# Patient Record
Sex: Female | Born: 1980 | Race: White | Hispanic: No | Marital: Single | Smoking: Never smoker
Health system: Southern US, Community
[De-identification: ages and names within clinical notes are randomized; demographics above are authoritative.]

## PROBLEM LIST (undated history)

## (undated) DIAGNOSIS — S83519A Sprain of anterior cruciate ligament of unspecified knee, initial encounter: Secondary | ICD-10-CM

## (undated) DIAGNOSIS — M94261 Chondromalacia, right knee: Secondary | ICD-10-CM

---

## 2010-05-09 HISTORY — PX: INGUINAL HERNIA REPAIR: SHX194

## 2015-06-17 ENCOUNTER — Encounter: Payer: Self-pay | Admitting: Family Medicine

## 2015-06-17 ENCOUNTER — Ambulatory Visit (INDEPENDENT_AMBULATORY_CARE_PROVIDER_SITE_OTHER): Payer: Managed Care, Other (non HMO) | Admitting: Family Medicine

## 2015-06-17 VITALS — BP 119/81 | HR 76 | Ht 66.0 in | Wt 155.0 lb

## 2015-06-17 DIAGNOSIS — M25551 Pain in right hip: Secondary | ICD-10-CM | POA: Diagnosis not present

## 2015-06-17 NOTE — Patient Instructions (Signed)
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 Ice over area of pain 3-4 times a day for 15 minutes at a time Standing hip rotations, hip side raise exercise 3 sets of 10-15 once a day - add weights if this becomes too easy. Stretches - pick 2 and hold for 20-30 seconds x 3 - do once or twice a day. Tylenol and/or aleve as needed for pain. Let me know if you want to try physical therapy or a cortisone injection. Given the length of time this has been going on can consider repeating your x-rays and an MRI of the area though these are likely to be normal based on your exam. Follow up with me in 5-6 weeks otherwise.

## 2015-06-18 DIAGNOSIS — M25551 Pain in right hip: Secondary | ICD-10-CM

## 2015-06-18 MED ORDER — METHYLPREDNISOLONE ACETATE 40 MG/ML IJ SUSP
40.0000 mg | Freq: Once | INTRAMUSCULAR | Status: AC
  Administered 2015-06-18: 40 mg via INTRA_ARTICULAR

## 2015-06-19 DIAGNOSIS — M25551 Pain in right hip: Secondary | ICD-10-CM | POA: Insufficient documentation

## 2015-06-19 NOTE — Assessment & Plan Note (Signed)
initial injury was traumatic to lateral hip with negative radiographs from 2009 (these are not available for review).  Based on her exam this fits with IT band syndrome and trochanteric bursitis.  We reviewed a home exercise program, stretches.  Tylenol/aleve as needed.  Bursa injection given as well.  Given length of time she has had this we discussed repeating imaging, considering MRI though based on her exam these are likely to be negative for other pathology.  She will call us if she wants to do physical therapy as well.  Follow up in 5-6 weeks.  After informed written consent patient was lying on left side on exam table.  Area overlying right trochanteric bursa prepped with alcohol swab then injected with 6:2 marcaine: depomedrol.  Patient tolerated procedure well without immediate complications.
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
## 2015-07-27 ENCOUNTER — Ambulatory Visit (INDEPENDENT_AMBULATORY_CARE_PROVIDER_SITE_OTHER): Payer: Managed Care, Other (non HMO) | Admitting: Family Medicine

## 2015-07-27 ENCOUNTER — Encounter: Payer: Self-pay | Admitting: Family Medicine

## 2015-07-27 ENCOUNTER — Encounter (INDEPENDENT_AMBULATORY_CARE_PROVIDER_SITE_OTHER): Payer: Self-pay

## 2015-07-27 VITALS — BP 131/83 | HR 71 | Ht 66.0 in | Wt 160.0 lb

## 2015-07-27 DIAGNOSIS — M25551 Pain in right hip: Secondary | ICD-10-CM | POA: Diagnosis not present

## 2015-07-27 NOTE — Patient Instructions (Signed)
Continue with your home exercises and stretches. Let us know if you want to do physical therapy or an MRI. You can check with Darl PikesSusan at the front desk in physical therapy - she may be able to let you know the cost of each visit. Otherwise follow up with me in 6 weeks or as needed.

## 2015-07-30 NOTE — Assessment & Plan Note (Signed)
initial injury was traumatic to lateral hip with negative radiographs from 2009 (these are not available for review).  Based on her exam this fits with IT band syndrome and trochanteric bursitis.  S/p trochanteric injection and has been doing home exercises but pain level still the same at 4/10.  We discussed options: physical therapy or MRI.  She will consider these and let us know if she wants to do either.  F/u prn otherwise.
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
## 2016-08-05 ENCOUNTER — Emergency Department (HOSPITAL_BASED_OUTPATIENT_CLINIC_OR_DEPARTMENT_OTHER)
Admission: EM | Admit: 2016-08-05 | Discharge: 2016-08-05 | Disposition: A | Discharge status: Home / Self Care | Payer: Managed Care, Other (non HMO) | Attending: Emergency Medicine | Admitting: Emergency Medicine

## 2016-08-05 ENCOUNTER — Emergency Department (HOSPITAL_BASED_OUTPATIENT_CLINIC_OR_DEPARTMENT_OTHER): Payer: Managed Care, Other (non HMO)

## 2016-08-05 ENCOUNTER — Encounter (HOSPITAL_BASED_OUTPATIENT_CLINIC_OR_DEPARTMENT_OTHER): Payer: Self-pay

## 2016-08-05 DIAGNOSIS — Y9389 Activity, other specified: Secondary | ICD-10-CM | POA: Insufficient documentation

## 2016-08-05 DIAGNOSIS — Y999 Unspecified external cause status: Secondary | ICD-10-CM | POA: Insufficient documentation

## 2016-08-05 DIAGNOSIS — X500XXA Overexertion from strenuous movement or load, initial encounter: Secondary | ICD-10-CM | POA: Diagnosis not present

## 2016-08-05 DIAGNOSIS — Y9289 Other specified places as the place of occurrence of the external cause: Secondary | ICD-10-CM | POA: Diagnosis not present

## 2016-08-05 DIAGNOSIS — S80212A Abrasion, left knee, initial encounter: Secondary | ICD-10-CM | POA: Diagnosis not present

## 2016-08-05 DIAGNOSIS — M25561 Pain in right knee: Secondary | ICD-10-CM | POA: Insufficient documentation

## 2016-08-05 DIAGNOSIS — S8992XA Unspecified injury of left lower leg, initial encounter: Secondary | ICD-10-CM | POA: Diagnosis present

## 2016-08-05 MED ORDER — IBUPROFEN 800 MG PO TABS: 800.0000 mg | ORAL_TABLET | Freq: Three times a day (TID) | ORAL | 0 refills | Status: DC

## 2016-08-05 MED FILL — IBUPROFEN 800 MG TABLET: 800 | 7 days supply | Qty: 21 | Fill #0

## 2016-08-05 NOTE — ED Notes (Signed)
Patient is resting comfortably. 

## 2016-08-05 NOTE — Discharge Instructions (Signed)
Take your medication as prescribed as needed for pain and swelling. I also recommend continuing to rest, elevate and apply ice your knee for 15-20 minutes 3-4 times daily. If her symptoms have not improved over the next week I recommend calling the orthopedic office below for further management and evaluation. Return to the emergency department if symptoms worsen or new onset of fever, redness, swelling, warmth, numbness, weakness, decreased range of motion, unable to ambulate.

## 2016-08-05 NOTE — ED Provider Notes (Signed)
[facility] Provider Note   CSN: [id number] Arrival date & time: 08/05/16  1300     History   Chief Complaint Chief Complaint  Patient presents with  . Knee Injury    HPI Carly Washington is a 36 y.o. female.  HPI   Patient is a 36 year old female with past medical history of systems the ED with right knee pain, onset prior to arrival. Patient reports while she was lifting at the gym she was trying to do a squat press she lost her balance resulting in her right knee turning out words she was squatting down. Patient reports "I feel like my knee gave out and then went back in place". She reports having associated pain and mild swelling to her knee since the injury. Patient reports applying ice and taking ibuprofen with improvement of pain. She reports having the barbell hit the side of her left leg resulting in a few small scrapes but denies any associated pain. Patient denies falling or any head injury. Denies any prior injuries or surgeries to either knee. Denies numbness, tingling, weakness.  History reviewed. No pertinent past medical history.  Patient Active Problem List   Diagnosis Date Noted  . Right hip pain 06/19/2015    Past Surgical History:  Procedure Laterality Date  . HERNIA REPAIR      OB History    No data available       Home Medications    Prior to Admission medications   Medication Sig Start Date End Date Taking? Authorizing Provider  desogestrel-ethinyl estradiol (APRI,EMOQUETTE,SOLIA) 0.15-30 MG-MCG tablet Take by mouth. 06/29/15   Historical Provider, MD  ibuprofen (ADVIL,MOTRIN) 800 MG tablet Take 1 tablet (800 mg total) by mouth 3 (three) times daily. 08/05/16   Barrett Henle, PA-C    Family History No family history on file.  Social History Social History  Substance Use Topics  . Smoking status: Never Smoker  . Smokeless tobacco: Never Used  . Alcohol use Yes     Comment: occ     Allergies   Sulfa  antibiotics   Review of Systems Review of Systems  Constitutional: Negative for fever.  Musculoskeletal: Positive for arthralgias (right knee) and joint swelling.  Skin: Positive for wound (scrape).  Neurological: Negative for weakness.     Physical Exam Updated Vital Signs BP 138/84 (BP Location: Right Arm)   Pulse 83   Temp 98.6 F (37 C) (Oral)   Resp 16   Ht  (1.676 m)   Wt 72.6 kg   LMP 07/24/2016   SpO2 100%   BMI 25.82 kg/m   Physical Exam  Constitutional: She is oriented to person, place, and time. She appears well-developed and well-nourished.  HENT:  Head: Normocephalic and atraumatic.  Eyes: Conjunctivae and EOM are normal. Right eye exhibits no discharge. Left eye exhibits no discharge. No scleral icterus.  Neck: Normal range of motion. Neck supple.  Cardiovascular: Normal rate and intact distal pulses.   Pulmonary/Chest: Effort normal.  Musculoskeletal: Normal range of motion. She exhibits tenderness. She exhibits no edema or deformity.       Right knee: She exhibits normal range of motion, no swelling, no effusion, no ecchymosis, no deformity, no laceration, no erythema, normal alignment, no LCL laxity, normal patellar mobility and no MCL laxity. Tenderness found. Lateral joint line tenderness noted.       Left knee: She exhibits normal range of motion, no swelling, no effusion, no ecchymosis, no deformity, no laceration, no erythema,  normal alignment, no LCL laxity, normal patellar mobility and no MCL laxity. No tenderness found.       Legs: Mild TTP over right lateral knee. FROM of bilateral knees, ankles and feet with 5/5 strength. 2+ PT pulses. Sensation grossly intact. No ACL/PCL/LCL/MCL laxity bilaterally.   Neurological: She is alert and oriented to person, place, and time.  Skin: Skin is warm and dry. Capillary refill takes less than 2 seconds.  Nursing note and vitals reviewed.    ED Treatments / Results  Labs (all labs ordered are listed,  but only abnormal results are displayed) Labs Reviewed - No data to display  EKG  EKG Interpretation None       Radiology Dg Knee Complete 4 Views Right  Result Date: 08/05/2016 CLINICAL DATA:  Pain after lifting EXAM: RIGHT KNEE - COMPLETE 4+ VIEW COMPARISON:  None. FINDINGS: Frontal, lateral, and bilateral oblique views were obtained. There is no fracture or dislocation. No joint effusion. The joint spaces appear normal. No erosive change. IMPRESSION: No fracture or joint effusion.  No apparent arthropathy. Electronically Signed   By: Bretta Bang III M.D.   On: 08/05/2016 13:40    Procedures Procedures (including critical care time)  Medications Ordered in ED Medications - No data to display   Initial Impression / Assessment and Plan / ED Course  I have reviewed the triage vital signs and the nursing notes.  Pertinent labs & imaging results that were available during my care of the patient were reviewed by me and considered in my medical decision making (see chart for details).     Patient X-Ray negative for obvious fracture or dislocation. Pain managed in ED. Pt advised to follow up with orthopedics if symptoms persist for possibility of missed fracture diagnosis. Conservative therapy recommended and discussed. Advised pt to use knee sleeve for comfort. Patient will be dc home & is agreeable with above plan. Discussed return precautions.   Final Clinical Impressions(s) / ED Diagnoses   Final diagnoses:  Acute pain of right knee    New Prescriptions New Prescriptions   IBUPROFEN (ADVIL,MOTRIN) 800 MG TABLET    Take 1 tablet (800 mg total) by mouth 3 (three) times daily.     Satira Sark [place], [place] 08/05/16 1413    Tilden Fossa, MD 08/05/16 1414

## 2016-08-05 NOTE — ED Triage Notes (Signed)
Pt reports lost balance while lifting at gym-felt right knee "may have gone out then back in"-NAD-presents to triage in w/c

## 2016-08-07 DIAGNOSIS — S83519A Sprain of anterior cruciate ligament of unspecified knee, initial encounter: Secondary | ICD-10-CM

## 2016-08-07 DIAGNOSIS — M94261 Chondromalacia, right knee: Secondary | ICD-10-CM

## 2016-08-07 HISTORY — DX: Chondromalacia, right knee: M94.261

## 2016-08-07 HISTORY — DX: Sprain of anterior cruciate ligament of unspecified knee, initial encounter: S83.519A

## 2016-08-25 NOTE — H&P (Signed)
Carly Washington is a very active 36 year old female. She works in [employer]. She had a noncontact vertical load injury awkwardly landing. Complete anterior cruciate ligament tear of the right knee. No previous knee problems. Her entire history, X-rays, MRI and MRI report have all been reviewed.  I saw her today to discuss definitive treatment. She obviously wants to continue to be active.   EXAMINATION: Well developed, well nourished female in no acute distress.  Alert and oriented x 3. lungs clear to auscultation bilaterally.  Heart sounds normal.  Examination of the right knee reveals abnormal ligamentous laxity. Obvious ACL deficiency on the right. She is a little bit sore posterior laterally but really no meniscal signs. Stable ligaments. Swelling is better. Reasonable motion. Neurovascularly intact distally.   Impression: RIght knee ACL tear  PLAN:  We spent more than 30 minutes today covering her injury and treatment. Her knee needs to be stabilized for what she wants to be able to do. She realizes this and wants to proceed. We discussed exam under anesthesia, arthroscopy, and assessment of the knee. We will do an autograft reconstruction. She wants to return to full activities as soon as possible. I told her I would look at her patellar tracking which does not look bad on exam. I want to look at the back of her kneecap and structures there and then decide whether to use patellar tendon or hamstring autograft. Those two approaches discussed in regard to post operative pain, time of recovery and release to activity. The procedure, risks and complications reviewed. Paperwork completed. All questions were encouraged and answered. She will be cautious with vertical loading and twisting in the interim. I will see her at the time of operative intervention. She is obviously out of work or doing no more than light duties in the interim.

## 2016-08-26 ENCOUNTER — Encounter (HOSPITAL_BASED_OUTPATIENT_CLINIC_OR_DEPARTMENT_OTHER): Payer: Self-pay | Admitting: *Deleted

## 2016-09-01 ENCOUNTER — Encounter (HOSPITAL_BASED_OUTPATIENT_CLINIC_OR_DEPARTMENT_OTHER)
Admission: RE | Disposition: A | Discharge status: Home / Self Care | Payer: Self-pay | Source: Ambulatory Visit | Attending: Orthopedic Surgery

## 2016-09-01 ENCOUNTER — Ambulatory Visit (HOSPITAL_BASED_OUTPATIENT_CLINIC_OR_DEPARTMENT_OTHER): Payer: Managed Care, Other (non HMO) | Admitting: Anesthesiology

## 2016-09-01 ENCOUNTER — Ambulatory Visit (HOSPITAL_BASED_OUTPATIENT_CLINIC_OR_DEPARTMENT_OTHER)
Admission: RE | Admit: 2016-09-01 | Discharge: 2016-09-01 | Disposition: A | Discharge status: Home / Self Care | Payer: Managed Care, Other (non HMO) | Source: Ambulatory Visit | Attending: Orthopedic Surgery | Admitting: Orthopedic Surgery

## 2016-09-01 ENCOUNTER — Encounter (HOSPITAL_BASED_OUTPATIENT_CLINIC_OR_DEPARTMENT_OTHER): Payer: Self-pay | Admitting: Anesthesiology

## 2016-09-01 DIAGNOSIS — X58XXXA Exposure to other specified factors, initial encounter: Secondary | ICD-10-CM | POA: Insufficient documentation

## 2016-09-01 DIAGNOSIS — M25361 Other instability, right knee: Secondary | ICD-10-CM | POA: Diagnosis not present

## 2016-09-01 DIAGNOSIS — S83511A Sprain of anterior cruciate ligament of right knee, initial encounter: Secondary | ICD-10-CM | POA: Diagnosis present

## 2016-09-01 HISTORY — DX: Chondromalacia, right knee: M94.261

## 2016-09-01 HISTORY — PX: KNEE ARTHROSCOPY WITH LATERAL MENISECTOMY: SHX6193

## 2016-09-01 HISTORY — PX: KNEE ARTHROSCOPY WITH ANTERIOR CRUCIATE LIGAMENT (ACL) REPAIR WITH HAMSTRING GRAFT: SHX5645

## 2016-09-01 HISTORY — DX: Sprain of anterior cruciate ligament of unspecified knee, initial encounter: S83.519A

## 2016-09-01 SURGERY — KNEE ARTHROSCOPY WITH ANTERIOR CRUCIATE LIGAMENT (ACL) REPAIR WITH HAMSTRING GRAFT
Anesthesia: General | Site: Knee | Laterality: Right

## 2016-09-01 MED ORDER — HYDROMORPHONE HCL 1 MG/ML IJ SOLN
INTRAMUSCULAR | Status: AC
  Filled 2016-09-01: qty 1

## 2016-09-01 MED ORDER — MORPHINE SULFATE (PF) 4 MG/ML IV SOLN
INTRAVENOUS | Status: AC
  Filled 2016-09-01: qty 1

## 2016-09-01 MED ORDER — FENTANYL CITRATE (PF) 100 MCG/2ML IJ SOLN
50.0000 ug | INTRAMUSCULAR | Status: DC | PRN
  Administered 2016-09-01: 75 ug via INTRAVENOUS

## 2016-09-01 MED ORDER — BUPIVACAINE-EPINEPHRINE (PF) 0.5% -1:200000 IJ SOLN
INTRAMUSCULAR | Status: DC | PRN
  Administered 2016-09-01: 25 mL via PERINEURAL

## 2016-09-01 MED ORDER — CHLORHEXIDINE GLUCONATE 4 % EX LIQD: 60.0000 mL | Freq: Once | CUTANEOUS | Status: DC

## 2016-09-01 MED ORDER — SODIUM CHLORIDE 0.9 % IR SOLN
Status: DC | PRN
  Administered 2016-09-01 (×4): 3000 mL

## 2016-09-01 MED ORDER — ONDANSETRON HCL 4 MG/2ML IJ SOLN
INTRAMUSCULAR | Status: DC | PRN
  Administered 2016-09-01: 4 mg via INTRAVENOUS

## 2016-09-01 MED ORDER — MIDAZOLAM HCL 2 MG/2ML IJ SOLN
1.0000 mg | INTRAMUSCULAR | Status: DC | PRN
  Administered 2016-09-01: 1.5 mg via INTRAVENOUS

## 2016-09-01 MED ORDER — ONDANSETRON HCL 4 MG/2ML IJ SOLN
INTRAMUSCULAR | Status: AC
  Filled 2016-09-01: qty 2

## 2016-09-01 MED ORDER — DOCUSATE SODIUM 100 MG PO CAPS: 100.0000 mg | ORAL_CAPSULE | Freq: Two times a day (BID) | ORAL | 0 refills | Status: DC

## 2016-09-01 MED ORDER — SCOPOLAMINE 1 MG/3DAYS TD PT72
MEDICATED_PATCH | TRANSDERMAL | Status: AC
  Filled 2016-09-01: qty 1

## 2016-09-01 MED ORDER — CEFAZOLIN SODIUM-DEXTROSE 2-4 GM/100ML-% IV SOLN
INTRAVENOUS | Status: AC
  Filled 2016-09-01: qty 100

## 2016-09-01 MED ORDER — KETOROLAC TROMETHAMINE 30 MG/ML IJ SOLN
30.0000 mg | Freq: Once | INTRAMUSCULAR | Status: AC
  Administered 2016-09-01: 30 mg via INTRAVENOUS

## 2016-09-01 MED ORDER — CEFAZOLIN SODIUM-DEXTROSE 2-4 GM/100ML-% IV SOLN
2.0000 g | INTRAVENOUS | Status: AC
  Administered 2016-09-01: 2 g via INTRAVENOUS

## 2016-09-01 MED ORDER — PROPOFOL 10 MG/ML IV BOLUS
INTRAVENOUS | Status: DC | PRN
  Administered 2016-09-01: 200 mg via INTRAVENOUS
  Administered 2016-09-01: 50 mg via INTRAVENOUS

## 2016-09-01 MED ORDER — HYDROMORPHONE HCL 1 MG/ML IJ SOLN
0.2500 mg | INTRAMUSCULAR | Status: DC | PRN
  Administered 2016-09-01 (×4): 0.5 mg via INTRAVENOUS

## 2016-09-01 MED ORDER — HYDROMORPHONE HCL 1 MG/ML IJ SOLN: 0.2500 mg | INTRAMUSCULAR | Status: DC | PRN

## 2016-09-01 MED ORDER — ONDANSETRON HCL 4 MG PO TABS: 4.0000 mg | ORAL_TABLET | Freq: Three times a day (TID) | ORAL | 0 refills | Status: DC | PRN

## 2016-09-01 MED ORDER — OXYCODONE-ACETAMINOPHEN 5-325 MG PO TABS
ORAL_TABLET | ORAL | Status: AC
  Filled 2016-09-01: qty 1

## 2016-09-01 MED ORDER — MIDAZOLAM HCL 2 MG/2ML IJ SOLN
INTRAMUSCULAR | Status: AC
  Filled 2016-09-01: qty 2

## 2016-09-01 MED ORDER — PROMETHAZINE HCL 25 MG/ML IJ SOLN: 6.2500 mg | INTRAMUSCULAR | Status: DC | PRN

## 2016-09-01 MED ORDER — LIDOCAINE 2% (20 MG/ML) 5 ML SYRINGE
INTRAMUSCULAR | Status: AC
  Filled 2016-09-01: qty 5

## 2016-09-01 MED ORDER — SCOPOLAMINE 1 MG/3DAYS TD PT72
1.0000 | MEDICATED_PATCH | Freq: Once | TRANSDERMAL | Status: DC | PRN
Start: 2016-09-01 — End: 2016-09-01

## 2016-09-01 MED ORDER — MORPHINE SULFATE 10 MG/ML IJ SOLN
INTRAMUSCULAR | Status: DC | PRN
  Administered 2016-09-01 (×3): 2 mg via INTRAVENOUS

## 2016-09-01 MED ORDER — MORPHINE SULFATE (PF) 10 MG/ML IV SOLN
INTRAVENOUS | Status: AC
  Filled 2016-09-01: qty 1

## 2016-09-01 MED ORDER — LACTATED RINGERS IV SOLN
INTRAVENOUS | Status: DC
  Administered 2016-09-01: 10 mL/h via INTRAVENOUS

## 2016-09-01 MED ORDER — OXYCODONE-ACETAMINOPHEN 5-325 MG PO TABS: 1.0000 | ORAL_TABLET | ORAL | 0 refills | Status: DC | PRN

## 2016-09-01 MED ORDER — SODIUM CHLORIDE 0.9 % IJ SOLN
INTRAMUSCULAR | Status: AC
  Filled 2016-09-01: qty 10

## 2016-09-01 MED ORDER — LACTATED RINGERS IV SOLN
INTRAVENOUS | Status: DC
Start: 2016-09-01 — End: 2016-09-01
  Administered 2016-09-01: 11:00:00 via INTRAVENOUS

## 2016-09-01 MED ORDER — OXYCODONE-ACETAMINOPHEN 5-325 MG PO TABS
1.0000 | ORAL_TABLET | Freq: Once | ORAL | Status: AC
Start: 2016-09-01 — End: 2016-09-01
  Administered 2016-09-01: 1 via ORAL

## 2016-09-01 MED ORDER — PROPOFOL 500 MG/50ML IV EMUL
INTRAVENOUS | Status: AC
Start: 2016-09-01 — End: 2016-09-01
  Filled 2016-09-01: qty 50

## 2016-09-01 MED ORDER — DEXAMETHASONE SODIUM PHOSPHATE 4 MG/ML IJ SOLN
INTRAMUSCULAR | Status: DC | PRN
  Administered 2016-09-01: 10 mg via INTRAVENOUS

## 2016-09-01 MED ORDER — FENTANYL CITRATE (PF) 100 MCG/2ML IJ SOLN
INTRAMUSCULAR | Status: AC
  Filled 2016-09-01: qty 2

## 2016-09-01 MED ORDER — LIDOCAINE 2% (20 MG/ML) 5 ML SYRINGE
INTRAMUSCULAR | Status: DC | PRN
  Administered 2016-09-01: 50 mg via INTRAVENOUS

## 2016-09-01 MED ORDER — ASPIRIN EC 81 MG PO TBEC: 81.0000 mg | DELAYED_RELEASE_TABLET | Freq: Every day | ORAL | 0 refills | Status: DC

## 2016-09-01 MED ORDER — KETOROLAC TROMETHAMINE 30 MG/ML IJ SOLN
INTRAMUSCULAR | Status: AC
  Filled 2016-09-01: qty 1

## 2016-09-01 MED ORDER — DEXAMETHASONE SODIUM PHOSPHATE 10 MG/ML IJ SOLN
INTRAMUSCULAR | Status: AC
  Filled 2016-09-01: qty 1

## 2016-09-01 SURGICAL SUPPLY — 80 items
ANCHOR BUTTON TIGHTROPE ACL RT (Orthopedic Implant) ×3 IMPLANT
ANCHOR BUTTON TIGHTROPE RN 14 (Anchor) ×3 IMPLANT
ANCHOR PUSHLOCK PEEK 3.5X19.5 (Anchor) ×3 IMPLANT
BANDAGE ACE 6X5 VEL STRL LF (GAUZE/BANDAGES/DRESSINGS) ×3 IMPLANT
BANDAGE ESMARK 6X9 LF (GAUZE/BANDAGES/DRESSINGS) ×1 IMPLANT
BENZOIN TINCTURE PRP APPL 2/3 (GAUZE/BANDAGES/DRESSINGS) ×3 IMPLANT
BLADE 4.2CUDA (BLADE) IMPLANT
BLADE CUDA 5.5 (BLADE) IMPLANT
BLADE CUDA GRT WHITE 3.5 (BLADE) IMPLANT
BLADE CUTTER GATOR 3.5 (BLADE) ×3 IMPLANT
BLADE CUTTER MENIS 5.5 (BLADE) IMPLANT
BLADE GREAT WHITE 4.2 (BLADE) ×2 IMPLANT
BLADE GREAT WHITE 4.2MM (BLADE) ×1
BLADE SURG 15 STRL LF DISP TIS (BLADE) ×1 IMPLANT
BLADE SURG 15 STRL SS (BLADE) ×2
BNDG ESMARK 6X9 LF (GAUZE/BANDAGES/DRESSINGS) ×3
BUR OVAL 6.0 (BURR) ×3 IMPLANT
CLOSURE WOUND 1/2 X4 (GAUZE/BANDAGES/DRESSINGS) ×1
COVER BACK TABLE 60X90IN (DRAPES) ×3 IMPLANT
CUFF TOURNIQUET SINGLE 34IN LL (TOURNIQUET CUFF) ×3 IMPLANT
CUTTER MENISCUS  4.2MM (BLADE)
CUTTER MENISCUS 4.2MM (BLADE) IMPLANT
DECANTER SPIKE VIAL GLASS SM (MISCELLANEOUS) IMPLANT
DRAPE ARTHROSCOPY W/POUCH 114 (DRAPES) ×3 IMPLANT
DRAPE OEC MINIVIEW 54X84 (DRAPES) ×3 IMPLANT
DRAPE U-SHAPE 47X51 STRL (DRAPES) ×3 IMPLANT
DURAPREP 26ML APPLICATOR (WOUND CARE) ×3 IMPLANT
ELECT MENISCUS 165MM 90D (ELECTRODE) IMPLANT
ELECT REM PT RETURN 9FT ADLT (ELECTROSURGICAL) ×3
ELECTRODE REM PT RTRN 9FT ADLT (ELECTROSURGICAL) ×1 IMPLANT
GAUZE SPONGE 4X4 12PLY STRL (GAUZE/BANDAGES/DRESSINGS) ×6 IMPLANT
GAUZE XEROFORM 1X8 LF (GAUZE/BANDAGES/DRESSINGS) ×3 IMPLANT
GLOVE BIO SURGEON STRL SZ 6.5 (GLOVE) ×2 IMPLANT
GLOVE BIO SURGEONS STRL SZ 6.5 (GLOVE) ×1
GLOVE BIOGEL PI IND STRL 7.0 (GLOVE) ×3 IMPLANT
GLOVE BIOGEL PI INDICATOR 7.0 (GLOVE) ×6
GLOVE ECLIPSE 7.0 STRL STRAW (GLOVE) ×3 IMPLANT
GLOVE SURG ORTHO 8.0 STRL STRW (GLOVE) ×3 IMPLANT
GOWN STRL REUS W/ TWL LRG LVL3 (GOWN DISPOSABLE) ×1 IMPLANT
GOWN STRL REUS W/ TWL XL LVL3 (GOWN DISPOSABLE) ×2 IMPLANT
GOWN STRL REUS W/TWL LRG LVL3 (GOWN DISPOSABLE) ×2
GOWN STRL REUS W/TWL XL LVL3 (GOWN DISPOSABLE) ×4
IMMOBILIZER KNEE 22 UNIV (SOFTGOODS) ×3 IMPLANT
IMMOBILIZER KNEE 24 THIGH 36 (MISCELLANEOUS) ×1 IMPLANT
IMMOBILIZER KNEE 24 UNIV (MISCELLANEOUS) ×3
IV NS IRRIG 3000ML ARTHROMATIC (IV SOLUTION) ×12 IMPLANT
KNEE WRAP E Z 3 GEL PACK (MISCELLANEOUS) ×3 IMPLANT
MANIFOLD NEPTUNE II (INSTRUMENTS) ×3 IMPLANT
NS IRRIG 1000ML POUR BTL (IV SOLUTION) ×3 IMPLANT
PACK ARTHROSCOPY DSU (CUSTOM PROCEDURE TRAY) ×3 IMPLANT
PACK BASIN DAY SURGERY FS (CUSTOM PROCEDURE TRAY) ×3 IMPLANT
PASSER SUT SWANSON 36MM LOOP (INSTRUMENTS) ×3 IMPLANT
PENCIL BUTTON HOLSTER BLD 10FT (ELECTRODE) ×3 IMPLANT
PIN DRILL ACL TIGHTROPE 4MM (PIN) IMPLANT
PK GRAFTLINK AUTO IMPLANT SYST (Anchor) ×3 IMPLANT
SET ARTHROSCOPY TUBING (MISCELLANEOUS) ×4
SET ARTHROSCOPY TUBING LN (MISCELLANEOUS) ×2 IMPLANT
SLEEVE SCD COMPRESS KNEE MED (MISCELLANEOUS) IMPLANT
SPONGE LAP 4X18 X RAY DECT (DISPOSABLE) ×3 IMPLANT
STRIP CLOSURE SKIN 1/2X4 (GAUZE/BANDAGES/DRESSINGS) ×2 IMPLANT
SUCTION FRAZIER HANDLE 10FR (MISCELLANEOUS)
SUCTION TUBE FRAZIER 10FR DISP (MISCELLANEOUS) IMPLANT
SUT 2 FIBERLOOP 20 STRT BLUE (SUTURE)
SUT ETHILON 3 0 PS 1 (SUTURE) ×3 IMPLANT
SUT FIBERWIRE #2 38 T-5 BLUE (SUTURE)
SUT MNCRL AB 3-0 PS2 18 (SUTURE) IMPLANT
SUT VIC AB 1 CT1 27 (SUTURE) ×4
SUT VIC AB 1 CT1 27XBRD ANBCTR (SUTURE) ×2 IMPLANT
SUT VIC AB 2-0 SH 27 (SUTURE) ×2
SUT VIC AB 2-0 SH 27XBRD (SUTURE) ×1 IMPLANT
SUT VIC AB 3-0 SH 27 (SUTURE)
SUT VIC AB 3-0 SH 27X BRD (SUTURE) IMPLANT
SUT VICRYL 4-0 PS2 18IN ABS (SUTURE) IMPLANT
SUTURE 2 FIBERLOOP 20 STRT BLU (SUTURE) IMPLANT
SUTURE FIBERWR #2 38 T-5 BLUE (SUTURE) IMPLANT
SYSTEM GRAFT IMPLANT AUTOGRAFT (Anchor) ×1 IMPLANT
TOWEL OR 17X24 6PK STRL BLUE (TOWEL DISPOSABLE) ×6 IMPLANT
TOWEL OR NON WOVEN STRL DISP B (DISPOSABLE) ×3 IMPLANT
WATER STERILE IRR 1000ML POUR (IV SOLUTION) ×3 IMPLANT
YANKAUER SUCT BULB TIP NO VENT (SUCTIONS) IMPLANT

## 2016-09-01 NOTE — Anesthesia Postprocedure Evaluation (Signed)
Anesthesia Post Note  Patient: Carly Washington  Procedure(s) Performed: Procedure(s) (LRB): RIGHT KNEE ARTHROSCOPY WITH ANTERIOR CRUCIATE LIGAMENT (ACL) REPAIR WITH HAMSTRING GRAFT, DEBRIDEMENT/SHAVING (Right) KNEE ARTHROSCOPY WITH PARTIAL LATERAL MENISECTOMY (Right)  Patient location during evaluation: PACU Anesthesia Type: General Level of consciousness: sedated Pain management: pain level controlled Vital Signs Assessment: post-procedure vital signs reviewed and stable Respiratory status: spontaneous breathing and respiratory function stable Cardiovascular status: stable Anesthetic complications: no       Last Vitals:  Vitals:   09/01/16 1545 09/01/16 1600  BP: 124/78 119/74  Pulse: 85 68  Resp: 18 15  Temp:      Last Pain:  Vitals:   09/01/16 1600  PainSc: 4                  [name] DANIEL

## 2016-09-01 NOTE — Progress Notes (Signed)
AssistedDr. Massagee with right, ultrasound guided, adductor canal block. Side rails up, monitors on throughout procedure. See vital signs in flow sheet. Tolerated Procedure well.  

## 2016-09-01 NOTE — Anesthesia Preprocedure Evaluation (Addendum)
Anesthesia Evaluation  Patient identified by MRN, date of birth, ID band Patient awake    Reviewed: Allergy & Precautions, NPO status , Patient's Chart, lab work & pertinent test results  Airway Mallampati: I   Neck ROM: Full    Dental  (+) Teeth Intact   Pulmonary neg pulmonary ROS,    breath sounds clear to auscultation       Cardiovascular negative cardio ROS   Rhythm:Regular Rate:Normal     Neuro/Psych negative neurological ROS  negative psych ROS   GI/Hepatic negative GI ROS, Neg liver ROS,   Endo/Other  negative endocrine ROS  Renal/GU negative Renal ROS  negative genitourinary   Musculoskeletal negative musculoskeletal ROS (+)   Abdominal   Peds negative pediatric ROS (+)  Hematology negative hematology ROS (+)   Anesthesia Other Findings   Reproductive/Obstetrics negative OB ROS                             Anesthesia Physical Anesthesia Plan  ASA: I  Anesthesia Plan: General   Post-op Pain Management:  Regional for Post-op pain   Induction: Intravenous  Airway Management Planned: LMA  Additional Equipment:   Intra-op Plan:   Post-operative Plan: Extubation in OR  Informed Consent: I have reviewed the patients History and Physical, chart, labs and discussed the procedure including the risks, benefits and alternatives for the proposed anesthesia with the patient or authorized representative who has indicated his/her understanding and acceptance.   Dental advisory given  Plan Discussed with: CRNA  Anesthesia Plan Comments:         Anesthesia Quick Evaluation

## 2016-09-01 NOTE — Transfer of Care (Signed)
Immediate Anesthesia Transfer of Care Note  Patient: Carly Washington  Procedure(s) Performed: Procedure(s): RIGHT KNEE ARTHROSCOPY WITH ANTERIOR CRUCIATE LIGAMENT (ACL) REPAIR WITH HAMSTRING GRAFT, DEBRIDEMENT/SHAVING (CHONDROPLASTY) (Right)  Patient Location: PACU  Anesthesia Type:General  Level of Consciousness: awake and sedated  Airway & Oxygen Therapy: Patient Spontanous Breathing and Patient connected to face mask oxygen  Post-op Assessment: Report given to RN and Post -op Vital signs reviewed and stable  Post vital signs: Reviewed and stable  Last Vitals:  Vitals:   09/01/16 1040 09/01/16 1411  BP: 122/86   Pulse: 87 84  Resp: 13 (!) 31  Temp: 36.9 C     Last Pain: There were no vitals filed for this visit.       Complications: No apparent anesthesia complications

## 2016-09-01 NOTE — Anesthesia Procedure Notes (Addendum)
Anesthesia Regional Block: Adductor canal block   Pre-Anesthetic Checklist: ,, timeout performed, Correct Patient, Correct Site, Correct Laterality, Correct Procedure, Correct Position, site marked, Risks and benefits discussed,  Surgical consent,  Pre-op evaluation,  At surgeon's request and post-op pain management  Laterality: Right and Lower  Prep: chloraprep       Needles:   Needle Type: Echogenic Stimulator Needle     Needle Length: 9cm  Needle Gauge: 21   Needle insertion depth: 5 cm   Additional Needles:   Procedures: ultrasound guided,,,,,,,,  Narrative:  Start time: 09/01/2016 11:05 AM End time: 09/01/2016 11:20 AM Injection made incrementally with aspirations every 5 mL.  Performed by: Personally  Anesthesiologist: Datha Kissinger

## 2016-09-01 NOTE — Interval H&P Note (Signed)
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 After consideration of risks, benefits and other options for treatment, the patient has consented to  Procedure(s): RIGHT KNEE ARTHROSCOPY WITH ANTERIOR CRUCIATE LIGAMENT (ACL) REPAIR WITH HAMSTRING GRAFT, DEBRIDEMENT/SHAVING (CHONDROPLASTY) (Right) as a surgical intervention .  The patient's history has been reviewed, patient examined, no change in status, stable for surgery.  I have reviewed the patient's chart and labs.  Questions were answered to the patient's satisfaction.     Loreta Ave

## 2016-09-01 NOTE — Discharge Instructions (Signed)
Utilize CPM as instructed.  Diet: As you were doing prior to hospitalization   Shower:  May shower but keep the wounds dry, use an occlusive plastic wrap, NO SOAKING IN TUB.  If the bandage gets wet, change with a clean dry gauze.   Dressing:  Leave dressing in place and we will change your bandages during your first follow-up appointment.  .  Activity:  Increase activity slowly as tolerated, but follow the weight bearing instructions below.  The rules on driving is that you can not be taking narcotics while you drive, and you must feel in control of the vehicle.    Weight Bearing:   As tolerated only when in knee immobilizer.    To prevent constipation: you may use a stool softener such as -  Colace (over the counter) 100 mg by mouth twice a day  Drink plenty of fluids (prune juice may be helpful) and high fiber foods Miralax (over the counter) for constipation as needed.    Itching:  If you experience itching with your medications, try taking only a single pain pill, or even half a pain pill at a time.  You may take up to 10 pain pills per day, and you can also use benadryl over the counter for itching or also to help with sleep.   Precautions:  If you experience chest pain or shortness of breath - call 911 immediately for transfer to the hospital emergency department!!  If you develop a fever greater that 101 F, purulent drainage from wound, increased redness or drainage from wound, or calf pain -- Call the office at [phone]                                                Follow- Up Appointment:  Please call for an appointment to be seen in 2 weeks [facility] - [phone]  Regional Anesthesia Blocks  1. Numbness or the inability to move the "blocked" extremity may last from 3-48 hours after placement. The length of time depends on the medication injected and your individual response to the medication. If the numbness is not going away after 48 hours, call your surgeon.  2. The  extremity that is blocked will need to be protected until the numbness is gone and the  Strength has returned. Because you cannot feel it, you will need to take extra care to avoid injury. Because it may be weak, you may have difficulty moving it or using it. You may not know what position it is in without looking at it while the block is in effect.  3. For blocks in the legs and feet, returning to weight bearing and walking needs to be done carefully. You will need to wait until the numbness is entirely gone and the strength has returned. You should be able to move your leg and foot normally before you try and bear weight or walk. You will need someone to be with you when you first try to ensure you do not fall and possibly risk injury.  4. Bruising and tenderness at the needle site are common side effects and will resolve in a few days.  5. Persistent numbness or new problems with movement should be communicated to the surgeon or the [facility] [phone] [facility] [phone]).  Post Anesthesia Home Care Instructions  Activity: Get  plenty of rest for the remainder of the day. A responsible individual must stay with you for 24 hours following the procedure.  For the next 24 hours, DO NOT: -Drive a car -Advertising copywriter -Drink alcoholic beverages -Take any medication unless instructed by your physician -Make any legal decisions or sign important papers.  Meals: Start with liquid foods such as gelatin or soup. Progress to regular foods as tolerated. Avoid greasy, spicy, heavy foods. If nausea and/or vomiting occur, drink only clear liquids until the nausea and/or vomiting subsides. Call your physician if vomiting continues.  Special Instructions/Symptoms: Your throat may feel dry or sore from the anesthesia or the breathing tube placed in your throat during surgery. If this causes discomfort, gargle with warm salt water. The discomfort should disappear  within 24 hours.  If you had a scopolamine patch placed behind your ear for the management of post- operative nausea and/or vomiting:  1. The medication in the patch is effective for 72 hours, after which it should be removed.  Wrap patch in a tissue and discard in the trash. Wash hands thoroughly with soap and water. 2. You may remove the patch earlier than 72 hours if you experience unpleasant side effects which may include dry mouth, dizziness or visual disturbances. 3. Avoid touching the patch. Wash your hands with soap and water after contact with the patch.

## 2016-09-01 NOTE — Anesthesia Procedure Notes (Signed)
Procedure Name: LMA Insertion Performed by: Aldine Chakraborty W Pre-anesthesia Checklist: Patient identified, Emergency Drugs available, Suction available and Patient being monitored Patient Re-evaluated:Patient Re-evaluated prior to inductionOxygen Delivery Method: Circle system utilized Preoxygenation: Pre-oxygenation with 100% oxygen Intubation Type: IV induction Ventilation: Mask ventilation without difficulty LMA: LMA inserted LMA Size: 4.0 Number of attempts: 1 Placement Confirmation: positive ETCO2 Tube secured with: Tape Dental Injury: Teeth and Oropharynx as per pre-operative assessment        

## 2016-09-02 ENCOUNTER — Encounter (HOSPITAL_BASED_OUTPATIENT_CLINIC_OR_DEPARTMENT_OTHER): Payer: Self-pay | Admitting: Orthopedic Surgery

## 2016-09-02 NOTE — Op Note (Signed)
NAME:  Carly Washington NO.:  MEDICAL RECORD NO.:  [id number]  LOCATION:                                 FACILITY:  PHYSICIAN:  Loreta Ave, M.D.      DATE OF BIRTH:  DATE OF PROCEDURE:  09/01/2016 DATE OF DISCHARGE:                              OPERATIVE REPORT   PREOPERATIVE DIAGNOSIS:  Right knee anterior cruciate ligament tear with anterolateral rotary instability.  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  PROCEDURES:  Right knee exam under anesthesia, arthroscopy.  Debridement of lateral meniscus.  Arthroscopic endoscopic ACL reconstruction utilizing hamstring quadrupled semi tendinosis autograft.  Arthrex technique.  .  EndoButton above with a washer and PushLock distally. Notchplasty.  SURGEON:  Loreta Ave, M.D.  ASSISTANT:  [facility] Terrilee Croak, [place], present throughout the entire case and necessary for timely completion of procedure.  ANESTHESIA:  General.  BLOOD LOSS:  Minimal.  SPECIMENS:  None.  CULTURES:  None.  COMPLICATIONS:  None.  DRESSINGS:  Sterile compressive knee immobilizer.  TOURNIQUET TIME:  1 hour.  DESCRIPTION OF PROCEDURE:  The patient was brought to the operating room, placed on the operating table in supine position.  After adequate anesthesia had been obtained, tourniquet applied.  Prepped and draped in usual sterile fashion.  Full motion.  Positive Lachman, drawer, pivot shift.  Exsanguinated with an elevation of Esmarch.  Tourniquet inflated to 300 mmHg.  Two portals; one each medial and lateral parapatellar. Arthroscope was introduced.  Knee distended and inspected.  Complete mid substance tear ACL debrided.  Narrow notch screw with notchplasty.  Mild chondromalacia medial patella.  Nothing required  treatment.  Tracking looked good.  Scuffing on the top of the lateral meniscus debrided, tapered in smoothly.  Medial meniscus looked great.  Instruments were fully removed.  Small incision at the hamstring attachment.  Semi tendinosis detached distally, harvested and then quadruple to be used as an ACL graft utilizing Arthrex instrumentation to place a graft. Arthroscope reduced.  Notchplasty with shaver and high-speed bur. Tibial tunnel at the incision over the hamstring through the footprint. Overdrilled with a 9 mm reamer.  Debris cleared with a shaver.  Femoral guide inserted across tibial tunnel notched on back cortex of femur. Tunnel created there as a docking tunnel over guidewire.  Debris cleared throughout.  Tunnel was assessed, found to be in good position.  Tubing Passer inserted across both tunnels out through a stab wound anterolateral thigh.  Graft attached to this, pulled in across the knee. EndoButton was brought out and then seated on the femur confirming good position on fluoroscopy.  Graft advanced into both tunnels.  It was then fixed distally utilizing Arthrex washer sutures tied over that, as any sutures then anchored down a little bit for the distal with a postop. At completion, nice solid stable fixation.  Good clearance of graft through full motion.  Great stability.  Instruments and fluid removed. Portals were closed with nylon.  Incision  closed in subcutaneous subcuticular manner.  Sterile compressive dressing applied.  Tourniquet deflated and removed.  Knee immobilizer applied.  Anesthesia reversed. Brought to the recovery room.  Tolerated the surgery well.  No complications.     Loreta Ave, M.D.     DFM/MEDQ  D:  09/01/2016  T:  09/01/2016  Job:  [id number]

## 2016-11-17 NOTE — Addendum Note (Signed)
Addendum  created 11/17/16 1650 by Shadow Schedler Ray, MD   Sign clinical note    

## 2016-11-17 NOTE — Anesthesia Postprocedure Evaluation (Signed)
Anesthesia Post Note  Patient: Carly Washington  Procedure(s) Performed: Procedure(s) (LRB): RIGHT KNEE ARTHROSCOPY WITH ANTERIOR CRUCIATE LIGAMENT (ACL) REPAIR WITH HAMSTRING GRAFT, DEBRIDEMENT/SHAVING (Right) KNEE ARTHROSCOPY WITH PARTIAL LATERAL MENISECTOMY (Right)     Anesthesia Post Evaluation  Last Vitals:  Vitals:   09/01/16 1600 09/01/16 1615  BP: 119/74 107/70  Pulse: 68 85  Resp: 15 15  Temp:      Last Pain:  Vitals:   09/01/16 1615  PainSc: 4                  Lowella CurbWarren Ray Rosealynn Mateus

## 2019-03-10 IMAGING — DX DG KNEE COMPLETE 4+V*R*
4 series · 4 of 4 positions shown · non-contrast
Comparison: None.

CLINICAL DATA: Pain after lifting

EXAM:
RIGHT KNEE - COMPLETE 4+ VIEW

[knee ap]
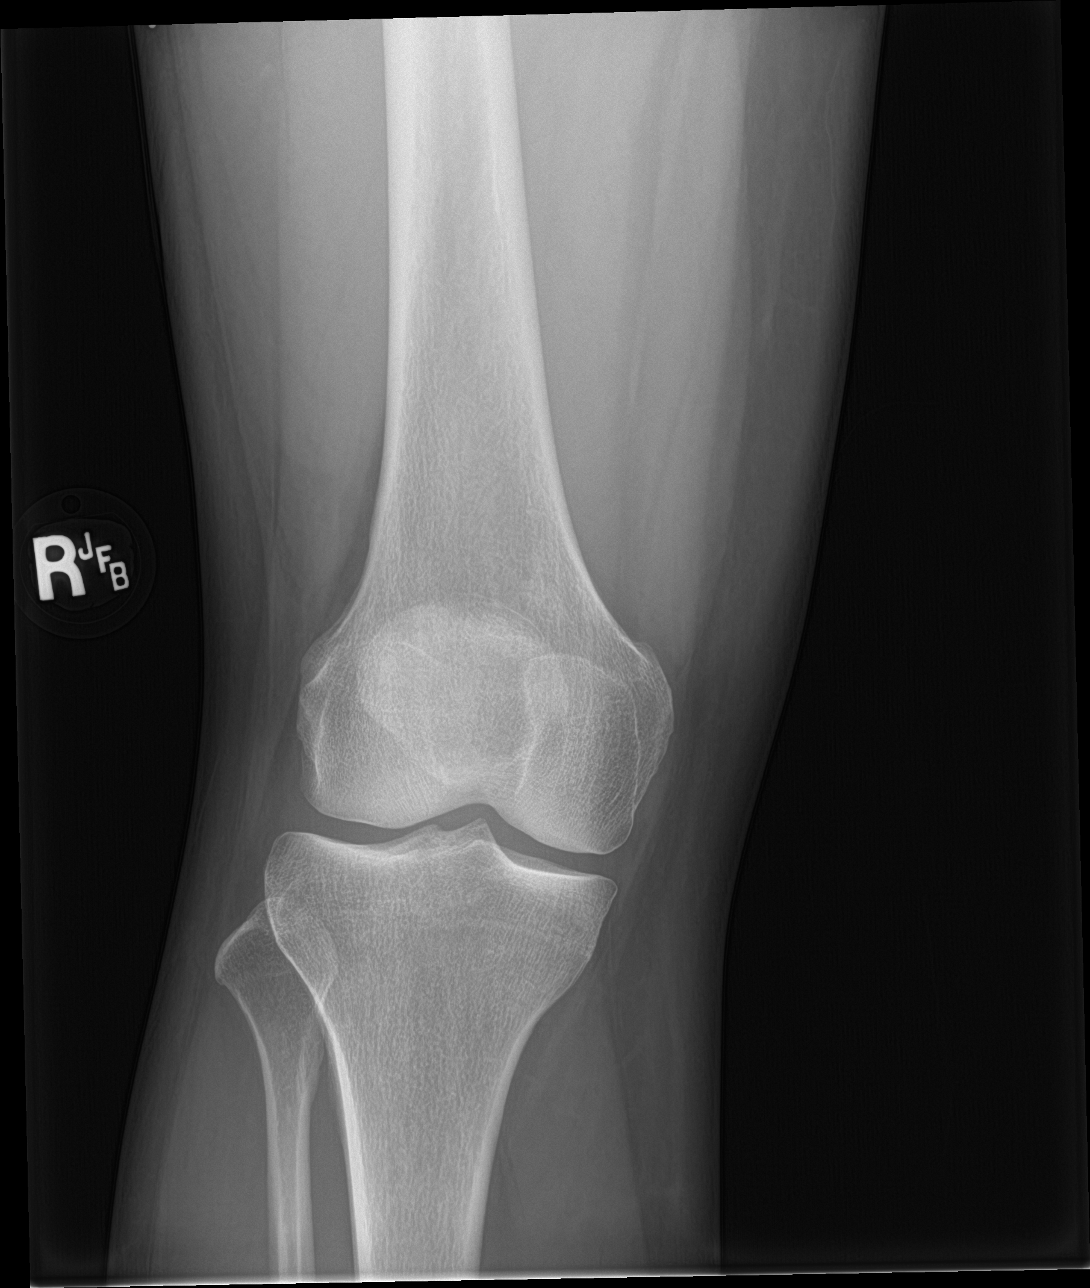

[knee lat]
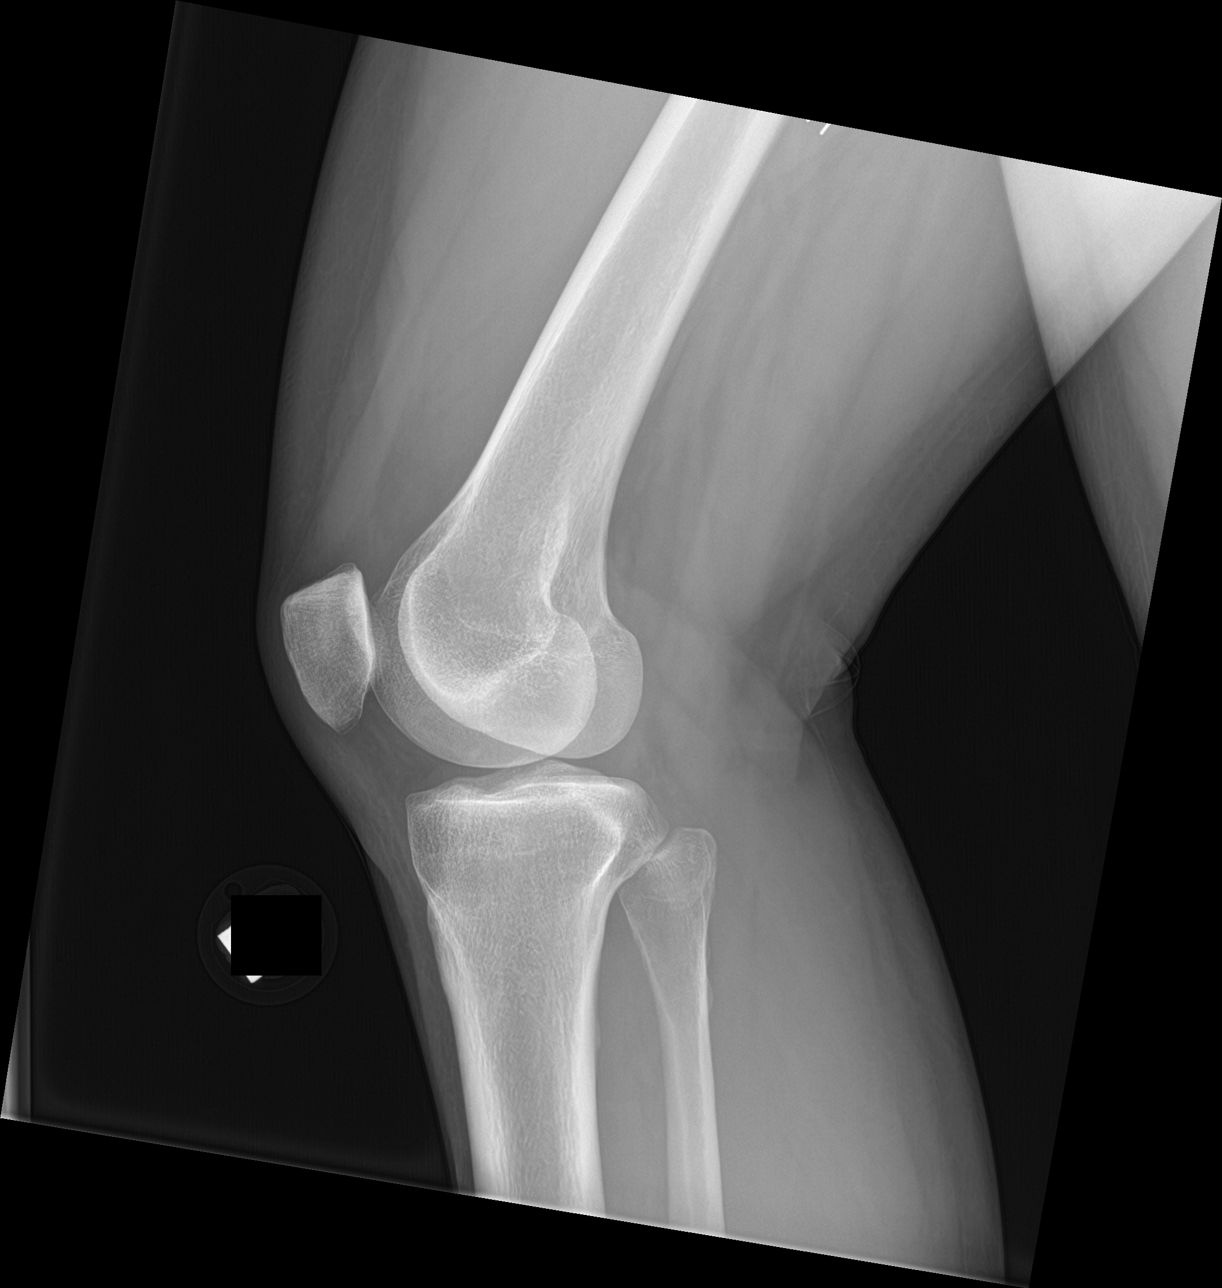

[knee obl (1 of 2)]
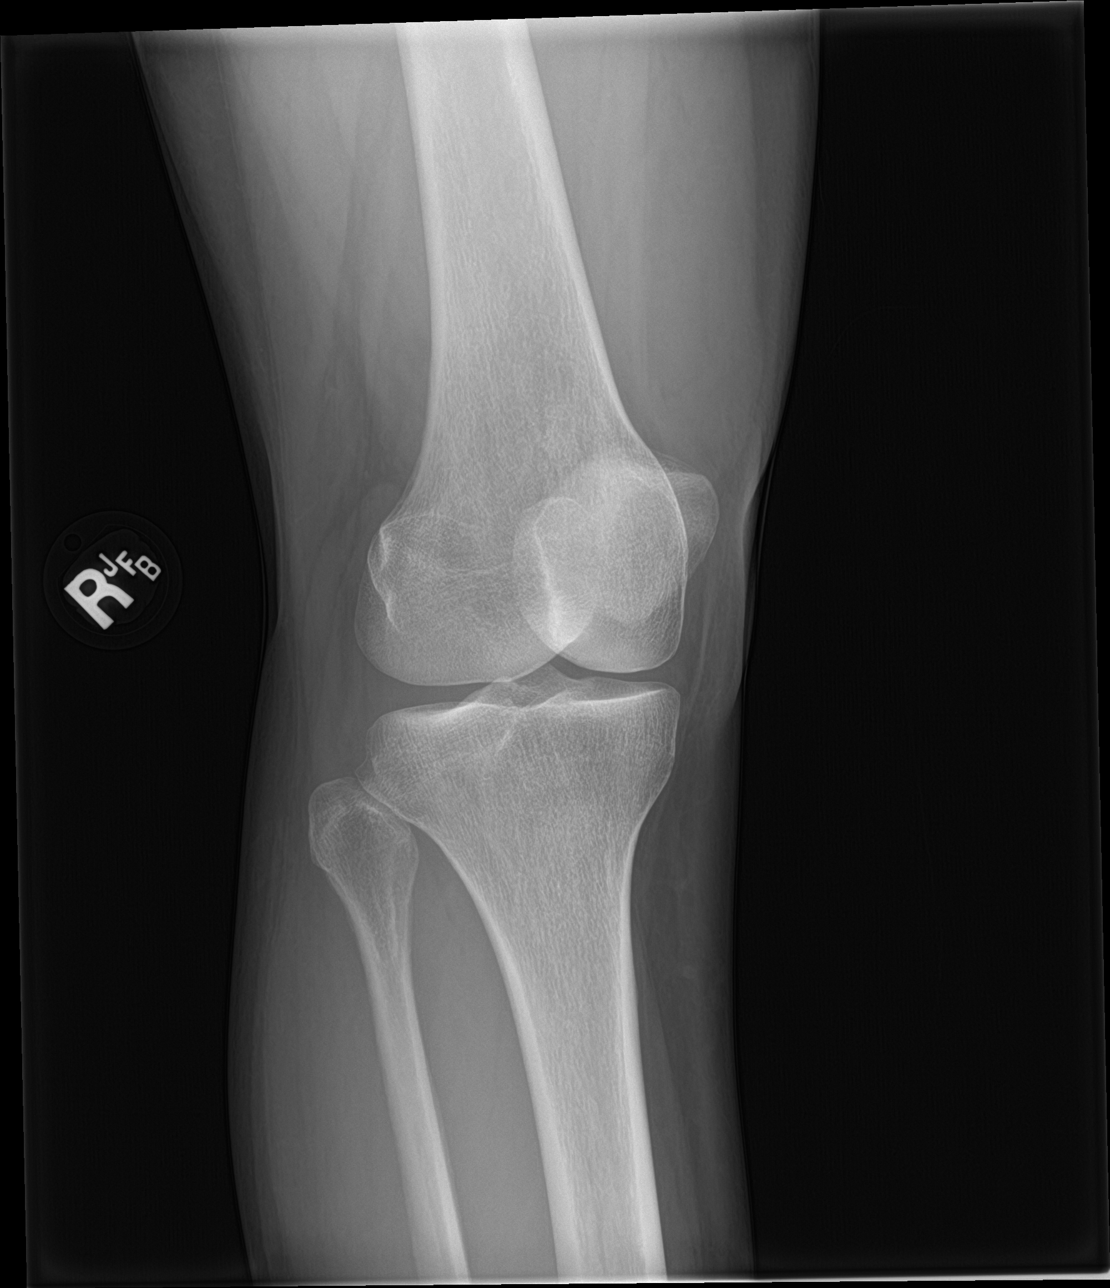

[knee obl (2 of 2)]
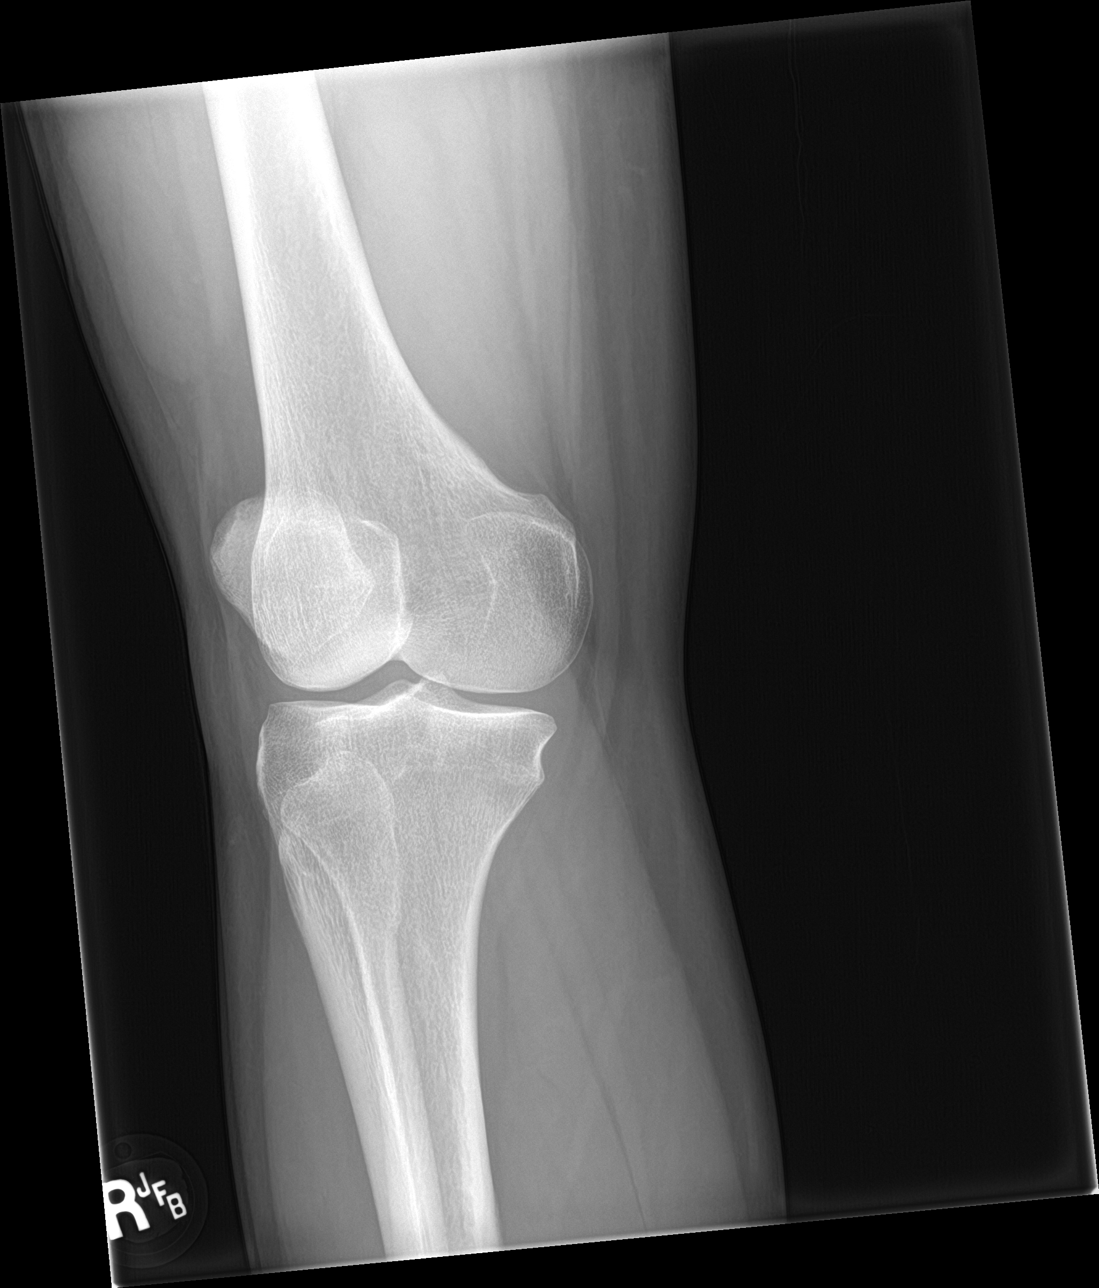

[4 of 4 positions shown; findings below may reference images not displayed]

FINDINGS: Frontal, lateral, and bilateral oblique views were obtained. There
is no fracture or dislocation. No joint effusion. The joint spaces
appear normal. No erosive change.
IMPRESSION: No fracture or joint effusion.  No apparent arthropathy.

## 2021-01-30 DIAGNOSIS — Y249XXA Unspecified firearm discharge, undetermined intent, initial encounter: Secondary | ICD-10-CM | POA: Insufficient documentation

## 2023-10-12 LAB — OB RESULTS CONSOLE ABO/RH: "RH Type ": POSITIVE

## 2023-10-12 LAB — OB RESULTS CONSOLE HIV ANTIBODY (ROUTINE TESTING): HIV: NONREACTIVE

## 2023-10-12 LAB — OB RESULTS CONSOLE HGB/HCT, BLOOD
HCT: 39 (ref 29–41)
Hemoglobin: 12.5

## 2023-10-12 LAB — OB RESULTS CONSOLE HEPATITIS B SURFACE ANTIGEN: Hepatitis B Surface Ag: NEGATIVE

## 2023-10-12 LAB — OB RESULTS CONSOLE RPR: RPR: NONREACTIVE

## 2023-10-12 LAB — OB RESULTS CONSOLE RUBELLA ANTIBODY, IGM: Rubella: IMMUNE

## 2023-10-12 LAB — OB RESULTS CONSOLE ANTIBODY SCREEN: Antibody Screen: NEGATIVE

## 2023-10-12 LAB — OB RESULTS CONSOLE TSH: TSH: 1.17

## 2023-10-12 LAB — OB RESULTS CONSOLE PLATELET COUNT: Platelets: 245

## 2024-03-27 ENCOUNTER — Encounter: Payer: Self-pay | Admitting: Certified Nurse Midwife

## 2024-03-27 ENCOUNTER — Ambulatory Visit: Admitting: Certified Nurse Midwife

## 2024-03-27 ENCOUNTER — Other Ambulatory Visit: Payer: Self-pay

## 2024-03-27 ENCOUNTER — Telehealth: Payer: Self-pay

## 2024-03-27 VITALS — BP 155/96 | HR 82 | Wt 204.0 lb

## 2024-03-27 DIAGNOSIS — D259 Leiomyoma of uterus, unspecified: Secondary | ICD-10-CM

## 2024-03-27 DIAGNOSIS — O26893 Other specified pregnancy related conditions, third trimester: Secondary | ICD-10-CM

## 2024-03-27 DIAGNOSIS — O0993 Supervision of high risk pregnancy, unspecified, third trimester: Secondary | ICD-10-CM

## 2024-03-27 DIAGNOSIS — O3413 Maternal care for benign tumor of corpus uteri, third trimester: Secondary | ICD-10-CM | POA: Diagnosis not present

## 2024-03-27 DIAGNOSIS — Z3A36 36 weeks gestation of pregnancy: Secondary | ICD-10-CM | POA: Diagnosis not present

## 2024-03-27 DIAGNOSIS — O163 Unspecified maternal hypertension, third trimester: Secondary | ICD-10-CM | POA: Diagnosis not present

## 2024-03-27 DIAGNOSIS — O0990 Supervision of high risk pregnancy, unspecified, unspecified trimester: Secondary | ICD-10-CM | POA: Insufficient documentation

## 2024-03-27 DIAGNOSIS — O0933 Supervision of pregnancy with insufficient antenatal care, third trimester: Secondary | ICD-10-CM

## 2024-03-27 DIAGNOSIS — O321XX0 Maternal care for breech presentation, not applicable or unspecified: Secondary | ICD-10-CM

## 2024-03-27 DIAGNOSIS — R12 Heartburn: Secondary | ICD-10-CM

## 2024-03-27 LAB — COMPREHENSIVE METABOLIC PANEL WITH GFR
ALT: 14 IU/L (ref 0–32)
AST: 22 IU/L (ref 0–40)
Albumin: 3.6 g/dL — ABNORMAL LOW (ref 3.9–4.9)
Alkaline Phosphatase: 140 IU/L — ABNORMAL HIGH (ref 41–116)
BUN/Creatinine Ratio: 14 (ref 9–23)
BUN: 12 mg/dL (ref 6–24)
Bilirubin Total: 0.3 mg/dL (ref 0.0–1.2)
CO2: 19 mmol/L — ABNORMAL LOW (ref 20–29)
Calcium: 9.7 mg/dL (ref 8.7–10.2)
Chloride: 100 mmol/L (ref 96–106)
Creatinine, Ser: 0.85 mg/dL (ref 0.57–1.00)
Globulin, Total: 2.7 g/dL (ref 1.5–4.5)
Glucose: 72 mg/dL (ref 70–99)
Potassium: 4.4 mmol/L (ref 3.5–5.2)
Sodium: 133 mmol/L — ABNORMAL LOW (ref 134–144)
Total Protein: 6.3 g/dL (ref 6.0–8.5)
eGFR: 88 mL/min/1.73 (ref >59)

## 2024-03-27 LAB — CBC
Hematocrit: 34.9 % (ref 34.0–46.6)
Hemoglobin: 11.3 g/dL (ref 11.1–15.9)
MCH: 28 pg (ref 26.6–33.0)
MCHC: 32.4 g/dL (ref 31.5–35.7)
MCV: 87 fL (ref 79–97)
Platelets: 303 x10E3/uL (ref 150–450)
RBC: 4.03 x10E6/uL (ref 3.77–5.28)
RDW: 14.5 % (ref 11.7–15.4)
WBC: 10.4 x10E3/uL (ref 3.4–10.8)

## 2024-03-27 MED ORDER — OMEPRAZOLE 40 MG PO CPDR: 40.0000 mg | DELAYED_RELEASE_CAPSULE | Freq: Every day | ORAL | 2 refills | Status: DC

## 2024-03-27 NOTE — Telephone Encounter (Addendum)
 Called patient to schedule initial OB appointment with Cornell Finder, CNM. Currently 36 weeks. Has been receiving care with home birth midwife. Patient will bring lab and ultrasound results.

## 2024-03-28 ENCOUNTER — Telehealth (HOSPITAL_COMMUNITY): Payer: Self-pay | Admitting: *Deleted

## 2024-03-28 ENCOUNTER — Encounter (HOSPITAL_COMMUNITY): Payer: Self-pay | Admitting: *Deleted

## 2024-03-28 NOTE — Telephone Encounter (Signed)
 Preadmission screen

## 2024-03-29 LAB — PROTEIN / CREATININE RATIO, URINE
Creatinine, Urine: 52.2 mg/dL
Protein, Ur: 10.1 mg/dL
Protein/Creat Ratio: 193 mg/g{creat} (ref 0–200)

## 2024-03-30 ENCOUNTER — Ambulatory Visit: Payer: Self-pay | Admitting: Certified Nurse Midwife

## 2024-04-01 ENCOUNTER — Ambulatory Visit (HOSPITAL_COMMUNITY)
Admission: AD | Admit: 2024-04-01 | Discharge: 2024-04-01 | Disposition: A | Discharge status: Home / Self Care | Attending: Family Medicine | Admitting: Family Medicine

## 2024-04-01 ENCOUNTER — Encounter (HOSPITAL_COMMUNITY): Payer: Self-pay | Admitting: Anesthesiology

## 2024-04-01 ENCOUNTER — Encounter (HOSPITAL_COMMUNITY): Payer: Self-pay | Admitting: Family Medicine

## 2024-04-01 ENCOUNTER — Other Ambulatory Visit: Payer: Self-pay

## 2024-04-01 ENCOUNTER — Observation Stay (HOSPITAL_COMMUNITY)

## 2024-04-01 DIAGNOSIS — O321XX0 Maternal care for breech presentation, not applicable or unspecified: Secondary | ICD-10-CM | POA: Diagnosis present

## 2024-04-01 DIAGNOSIS — O133 Gestational [pregnancy-induced] hypertension without significant proteinuria, third trimester: Secondary | ICD-10-CM | POA: Insufficient documentation

## 2024-04-01 DIAGNOSIS — Z8759 Personal history of other complications of pregnancy, childbirth and the puerperium: Secondary | ICD-10-CM | POA: Diagnosis present

## 2024-04-01 DIAGNOSIS — D259 Leiomyoma of uterus, unspecified: Secondary | ICD-10-CM | POA: Diagnosis present

## 2024-04-01 DIAGNOSIS — O139 Gestational [pregnancy-induced] hypertension without significant proteinuria, unspecified trimester: Secondary | ICD-10-CM | POA: Diagnosis present

## 2024-04-01 LAB — CBC
HCT: 34.9 % — ABNORMAL LOW (ref 36.0–46.0)
Hemoglobin: 11.5 g/dL — ABNORMAL LOW (ref 12.0–15.0)
MCH: 28.3 pg (ref 26.0–34.0)
MCHC: 33 g/dL (ref 30.0–36.0)
MCV: 86 fL (ref 80.0–100.0)
Platelets: 268 K/uL (ref 150–400)
RBC: 4.06 MIL/uL (ref 3.87–5.11)
RDW: 14.9 % (ref 11.5–15.5)
WBC: 8.9 K/uL (ref 4.0–10.5)
nRBC: 0 % (ref 0.0–0.2)

## 2024-04-01 LAB — TYPE AND SCREEN
ABO/RH(D): A POS
Antibody Screen: NEGATIVE

## 2024-04-01 MED ORDER — TERBUTALINE SULFATE 1 MG/ML IJ SOLN
0.2500 mg | Freq: Once | INTRAMUSCULAR | Status: AC
  Administered 2024-04-01: 0.25 mg via SUBCUTANEOUS

## 2024-04-01 MED ORDER — TERBUTALINE SULFATE 1 MG/ML IJ SOLN
INTRAMUSCULAR | Status: AC
  Filled 2024-04-01: qty 1

## 2024-04-01 MED ORDER — LACTATED RINGERS IV SOLN: INTRAVENOUS | Status: DC
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
## 2024-04-01 NOTE — Procedures (Signed)
 After informed verbal consent, Terbutaline  0.25 mg SQ given, ECV was attempted under Ultrasound guidance.  Forward roll attempted x 3.  There was no movement of the baby. FHR was reactive before and after the procedure.   Pt. Tolerated the procedure well.

## 2024-04-01 NOTE — Progress Notes (Signed)
 Patient ID: Linley Moskal, female   DOB: [birth year], 43 y.o.   MRN: [id number]  BP elevated on arrival, but returned to normal post-ECV. Now meets diagnostic criteria for gestational hypertension. Explained to pt that our recommendation for gHTN >37wks is delivery, which would mean a primary CS today. Pt very reticent to proceed, explained r/b/a of delivery today vs waiting and how gHTN can lead to seizures, strokes, etc. Gave time for her to discuss with her husband and homebirth midwife.   Returned later, pt requests to go home and discuss in person with her team/process the information and likely come back for pLTCS later in the week. Gave strong preeclampsia precautions and offered an office visit on Wednesday, pt to let me know if she'd like to come in/when she'd like her surgical delivery. MD aware of pt decision, ready for discharge.  Cornell Finder, CNM, MSN, IBCLC Certified Nurse Midwife, [facility]

## 2024-04-01 NOTE — Progress Notes (Signed)
 Dr. Fredirick and Cornell Finder, CNM at bedside. Version attempted. Patient tolerated well.

## 2024-04-05 NOTE — Progress Notes (Unsigned)
   PRENATAL VISIT NOTE  Subjective:  Carly Washington is a 43 y.o. G2P0010 at [redacted]w[redacted]d being seen today for ongoing prenatal care.  She is currently monitored for the following issues for this {Blank single:19197::high-risk,low-risk} pregnancy and has Supervision of high risk pregnancy, antepartum; Uterine fibroid in pregnancy; Breech presentation; and Gestational hypertension on their problem list.  Patient reports {sx:14538}.  Contractions: Not present. Vag. Bleeding: None.  Movement: Present. Denies leaking of fluid.   The following portions of the patient's history were reviewed and updated as appropriate: allergies, current medications, past family history, past medical history, past social history, past surgical history and problem list.   Objective:   Vitals:   03/27/24 1516 03/27/24 1612  BP: (!) 153/96 (!) 155/96  Pulse: 80 82  Weight: 204 lb (92.5 kg)     Fetal Status:  Fetal Heart Rate (bpm): 120   Movement: Present    General: Alert, oriented and cooperative. Patient is in no acute distress.  Skin: Skin is warm and dry. No rash noted.   Cardiovascular: Normal heart rate noted  Respiratory: Normal respiratory effort, no problems with respiration noted  Abdomen: Soft, gravid, appropriate for gestational age.  Pain/Pressure: Absent     Pelvic: {Blank single:19197::Cervical exam performed in the presence of a chaperone,Cervical exam deferred}        Extremities: Normal range of motion.     Mental Status: Normal mood and affect. Normal behavior. Normal judgment and thought content.      03/27/2024    3:15 PM  Depression screen PHQ 2/9  Decreased Interest 0  Down, Depressed, Hopeless 0  PHQ - 2 Score 0  Altered sleeping 0  Tired, decreased energy 1  Change in appetite 0  Feeling bad or failure about yourself  0  Trouble concentrating 0  Moving slowly or fidgety/restless 0  Suicidal thoughts 0  PHQ-9 Score 1        03/27/2024    3:15 PM  GAD 7 : Generalized  Anxiety Score  Nervous, Anxious, on Edge 0  Control/stop worrying 0  Worry too much - different things 0  Trouble relaxing 0  Restless 0  Easily annoyed or irritable 0  Afraid - awful might happen 0  Total GAD 7 Score 0    Assessment and Plan:  Pregnancy: G2P0010 at [redacted]w[redacted]d 1. Supervision of high risk pregnancy in third trimester (Primary) ***  2. [redacted] weeks gestation of pregnancy ***  3. Heartburn during pregnancy in third trimester *** - omeprazole  (PRILOSEC) 40 MG capsule; Take 1 capsule (40 mg total) by mouth daily.  Dispense: 30 capsule; Refill: 2  4. Elevated blood pressure affecting pregnancy in third trimester, antepartum *** - CBC - Comp Met (CMET) - Protein / creatinine ratio, urine  {Blank single:19197::Term,Preterm} labor symptoms and general obstetric precautions including but not limited to vaginal bleeding, contractions, leaking of fluid and fetal movement were reviewed in detail with the patient. Please refer to After Visit Summary for other counseling recommendations.   No follow-ups on file.  No future appointments.  Cornell JONELLE Finder, CNM

## 2024-04-18 ENCOUNTER — Encounter (HOSPITAL_COMMUNITY): Payer: Self-pay

## 2024-04-18 NOTE — Patient Instructions (Signed)
 Carly Washington  04/18/2024   Your procedure is scheduled on:  04/23/2024  Arrive at 0745 at Entrance C on [address]  and [address]. You are invited to use the FREE valet parking or use the Visitor's parking deck.  Pick up the phone at the desk and dial [phone].  Call this number if you have problems the morning of surgery: [phone]  Remember:   Do not eat food:(After Midnight) Desps de medianoche.  You may drink clear liquids until  ___0545__.  Clear liquids means a liquid you can see thru.  It can have color such as Cola or Kool aid.  Tea is OK and coffee as long as no milk or creamer of any kind.  Take these medicines the morning of surgery with A SIP OF WATER:  none   Do not wear jewelry, make-up or nail polish.  Do not wear lotions, powders, or perfumes. Do not wear deodorant.  Do not shave 48 hours prior to surgery.  Do not bring valuables to the hospital.  [facility] is not   responsible for any belongings or valuables brought to the hospital.  Contacts, dentures or bridgework may not be worn into surgery.  Leave suitcase in the car. After surgery it may be brought to your room.  For patients admitted to the hospital, checkout time is 11:00 AM the day of              discharge.      Please read over the following fact sheets that you were given:     Preparing for Surgery

## 2024-04-19 ENCOUNTER — Other Ambulatory Visit: Payer: Self-pay | Admitting: Obstetrics and Gynecology

## 2024-04-22 ENCOUNTER — Encounter (HOSPITAL_COMMUNITY)
Admission: RE | Admit: 2024-04-22 | Discharge: 2024-04-22 | Disposition: A | Source: Ambulatory Visit | Attending: Family Medicine

## 2024-04-22 DIAGNOSIS — Z01812 Encounter for preprocedural laboratory examination: Secondary | ICD-10-CM | POA: Insufficient documentation

## 2024-04-22 LAB — COMPREHENSIVE METABOLIC PANEL WITH GFR
ALT: 14 U/L (ref 0–44)
AST: 20 U/L (ref 15–41)
Albumin: 2.7 g/dL — ABNORMAL LOW (ref 3.5–5.0)
Alkaline Phosphatase: 164 U/L — ABNORMAL HIGH (ref 38–126)
Anion gap: 9 (ref 5–15)
BUN: 12 mg/dL (ref 6–20)
CO2: 22 mmol/L (ref 22–32)
Calcium: 8.6 mg/dL — ABNORMAL LOW (ref 8.9–10.3)
Chloride: 103 mmol/L (ref 98–111)
Creatinine, Ser: 0.92 mg/dL (ref 0.44–1.00)
GFR, Estimated: >60 mL/min (ref >60)
Glucose, Bld: 81 mg/dL (ref 70–99)
Potassium: 4.5 mmol/L (ref 3.5–5.1)
Sodium: 134 mmol/L — ABNORMAL LOW (ref 135–145)
Total Bilirubin: 0.7 mg/dL (ref 0.0–1.2)
Total Protein: 6.6 g/dL (ref 6.5–8.1)

## 2024-04-22 LAB — CBC
HCT: 36.3 % (ref 36.0–46.0)
Hemoglobin: 11.7 g/dL — ABNORMAL LOW (ref 12.0–15.0)
MCH: 28.1 pg (ref 26.0–34.0)
MCHC: 32.2 g/dL (ref 30.0–36.0)
MCV: 87.1 fL (ref 80.0–100.0)
Platelets: 326 K/uL (ref 150–400)
RBC: 4.17 MIL/uL (ref 3.87–5.11)
RDW: 15.4 % (ref 11.5–15.5)
WBC: 10.6 K/uL — ABNORMAL HIGH (ref 4.0–10.5)
nRBC: 0 % (ref 0.0–0.2)

## 2024-04-22 LAB — TYPE AND SCREEN
ABO/RH(D): A POS
Antibody Screen: NEGATIVE

## 2024-04-22 LAB — SYPHILIS: RPR W/REFLEX TO RPR TITER AND TREPONEMAL ANTIBODIES, TRADITIONAL SCREENING AND DIAGNOSIS ALGORITHM: RPR Ser Ql: NONREACTIVE

## 2024-04-23 ENCOUNTER — Inpatient Hospital Stay (HOSPITAL_COMMUNITY): Payer: Self-pay | Admitting: Anesthesiology

## 2024-04-23 ENCOUNTER — Encounter (HOSPITAL_COMMUNITY): Admission: RE | Disposition: A | Payer: Self-pay | Source: Home / Self Care | Attending: Family Medicine

## 2024-04-23 ENCOUNTER — Encounter (HOSPITAL_COMMUNITY): Payer: Self-pay | Admitting: Obstetrics and Gynecology

## 2024-04-23 ENCOUNTER — Inpatient Hospital Stay (HOSPITAL_COMMUNITY)
Admission: RE | Admit: 2024-04-23 | Discharge: 2024-04-25 | DRG: 787 | Disposition: A | Discharge status: Home / Self Care | Attending: Obstetrics and Gynecology | Admitting: Obstetrics and Gynecology

## 2024-04-23 ENCOUNTER — Other Ambulatory Visit: Payer: Self-pay

## 2024-04-23 DIAGNOSIS — O329XX0 Maternal care for malpresentation of fetus, unspecified, not applicable or unspecified: Secondary | ICD-10-CM | POA: Diagnosis present

## 2024-04-23 DIAGNOSIS — D62 Acute posthemorrhagic anemia: Secondary | ICD-10-CM | POA: Diagnosis not present

## 2024-04-23 DIAGNOSIS — O9962 Diseases of the digestive system complicating childbirth: Secondary | ICD-10-CM | POA: Diagnosis present

## 2024-04-23 DIAGNOSIS — O9081 Anemia of the puerperium: Secondary | ICD-10-CM | POA: Diagnosis not present

## 2024-04-23 DIAGNOSIS — Z8759 Personal history of other complications of pregnancy, childbirth and the puerperium: Secondary | ICD-10-CM | POA: Diagnosis present

## 2024-04-23 DIAGNOSIS — O3413 Maternal care for benign tumor of corpus uteri, third trimester: Secondary | ICD-10-CM | POA: Diagnosis present

## 2024-04-23 DIAGNOSIS — O134 Gestational [pregnancy-induced] hypertension without significant proteinuria, complicating childbirth: Secondary | ICD-10-CM | POA: Diagnosis present

## 2024-04-23 DIAGNOSIS — Z3A40 40 weeks gestation of pregnancy: Secondary | ICD-10-CM

## 2024-04-23 DIAGNOSIS — O321XX0 Maternal care for breech presentation, not applicable or unspecified: Principal | ICD-10-CM | POA: Diagnosis present

## 2024-04-23 DIAGNOSIS — D259 Leiomyoma of uterus, unspecified: Secondary | ICD-10-CM | POA: Diagnosis present

## 2024-04-23 DIAGNOSIS — K219 Gastro-esophageal reflux disease without esophagitis: Secondary | ICD-10-CM | POA: Diagnosis present

## 2024-04-23 DIAGNOSIS — O99334 Smoking (tobacco) complicating childbirth: Secondary | ICD-10-CM | POA: Diagnosis present

## 2024-04-23 DIAGNOSIS — O09523 Supervision of elderly multigravida, third trimester: Secondary | ICD-10-CM | POA: Diagnosis not present

## 2024-04-23 DIAGNOSIS — F17290 Nicotine dependence, other tobacco product, uncomplicated: Secondary | ICD-10-CM | POA: Diagnosis present

## 2024-04-23 DIAGNOSIS — O0990 Supervision of high risk pregnancy, unspecified, unspecified trimester: Principal | ICD-10-CM

## 2024-04-23 DIAGNOSIS — O480 Post-term pregnancy: Secondary | ICD-10-CM | POA: Diagnosis not present

## 2024-04-23 DIAGNOSIS — O0933 Supervision of pregnancy with insufficient antenatal care, third trimester: Secondary | ICD-10-CM | POA: Diagnosis not present

## 2024-04-23 LAB — PROTEIN / CREATININE RATIO, URINE
Creatinine, Urine: 81 mg/dL
Protein Creatinine Ratio: 0.1 mg/mg (ref <0.2)
Total Protein, Urine: 10 mg/dL

## 2024-04-23 LAB — COMPREHENSIVE METABOLIC PANEL WITH GFR
ALT: 12 U/L (ref 0–44)
AST: 22 U/L (ref 15–41)
Albumin: 3.2 g/dL — ABNORMAL LOW (ref 3.5–5.0)
Alkaline Phosphatase: 176 U/L — ABNORMAL HIGH (ref 38–126)
Anion gap: 12 (ref 5–15)
BUN: 12 mg/dL (ref 6–20)
CO2: 19 mmol/L — ABNORMAL LOW (ref 22–32)
Calcium: 9.2 mg/dL (ref 8.9–10.3)
Chloride: 102 mmol/L (ref 98–111)
Creatinine, Ser: 0.83 mg/dL (ref 0.44–1.00)
GFR, Estimated: >60 mL/min (ref >60)
Glucose, Bld: 184 mg/dL — ABNORMAL HIGH (ref 70–99)
Potassium: 4.5 mmol/L (ref 3.5–5.1)
Sodium: 133 mmol/L — ABNORMAL LOW (ref 135–145)
Total Bilirubin: 0.2 mg/dL (ref 0.0–1.2)
Total Protein: 6.3 g/dL — ABNORMAL LOW (ref 6.5–8.1)

## 2024-04-23 LAB — CBC
HCT: 33.4 % — ABNORMAL LOW (ref 36.0–46.0)
Hemoglobin: 11 g/dL — ABNORMAL LOW (ref 12.0–15.0)
MCH: 27.6 pg (ref 26.0–34.0)
MCHC: 32.9 g/dL (ref 30.0–36.0)
MCV: 83.7 fL (ref 80.0–100.0)
Platelets: 279 K/uL (ref 150–400)
RBC: 3.99 MIL/uL (ref 3.87–5.11)
RDW: 15.2 % (ref 11.5–15.5)
WBC: 15.3 K/uL — ABNORMAL HIGH (ref 4.0–10.5)
nRBC: 0 % (ref 0.0–0.2)

## 2024-04-23 SURGERY — Surgical Case
Anesthesia: Spinal | Site: Abdomen

## 2024-04-23 MED ORDER — MEPERIDINE HCL 25 MG/ML IJ SOLN: 6.2500 mg | INTRAMUSCULAR | Status: DC | PRN

## 2024-04-23 MED ORDER — DIPHENHYDRAMINE HCL 25 MG PO CAPS
25.0000 mg | ORAL_CAPSULE | Freq: Four times a day (QID) | ORAL | Status: DC | PRN
  Administered 2024-04-24 (×2): 25 mg via ORAL
  Filled 2024-04-23 (×2): qty 1

## 2024-04-23 MED ORDER — MENTHOL 3 MG MT LOZG: 1.0000 | LOZENGE | OROMUCOSAL | Status: DC | PRN

## 2024-04-23 MED ORDER — SENNOSIDES-DOCUSATE SODIUM 8.6-50 MG PO TABS
2.0000 | ORAL_TABLET | Freq: Every day | ORAL | Status: DC
  Administered 2024-04-24 – 2024-04-25 (×2): 2 via ORAL
  Filled 2024-04-23 (×2): qty 2

## 2024-04-23 MED ORDER — ONDANSETRON HCL 4 MG/2ML IJ SOLN: 4.0000 mg | Freq: Once | INTRAMUSCULAR | Status: DC | PRN

## 2024-04-23 MED ORDER — ENOXAPARIN SODIUM 40 MG/0.4ML IJ SOSY: 40.0000 mg | PREFILLED_SYRINGE | INTRAMUSCULAR | Status: DC

## 2024-04-23 MED ORDER — NIFEDIPINE ER OSMOTIC RELEASE 30 MG PO TB24
30.0000 mg | ORAL_TABLET | Freq: Every day | ORAL | Status: DC
  Administered 2024-04-23: 20:00:00 30 mg via ORAL
  Filled 2024-04-23: qty 1

## 2024-04-23 MED ORDER — POTASSIUM CHLORIDE CRYS ER 20 MEQ PO TBCR
20.0000 meq | EXTENDED_RELEASE_TABLET | Freq: Every day | ORAL | Status: DC
  Administered 2024-04-24 – 2024-04-25 (×2): 20 meq via ORAL
  Filled 2024-04-23 (×2): qty 1

## 2024-04-23 MED ORDER — TRANEXAMIC ACID-NACL 1000-0.7 MG/100ML-% IV SOLN
INTRAVENOUS | Status: AC
  Filled 2024-04-23: qty 100

## 2024-04-23 MED ORDER — CEFAZOLIN SODIUM-DEXTROSE 2-4 GM/100ML-% IV SOLN
INTRAVENOUS | Status: AC
  Filled 2024-04-23: qty 100

## 2024-04-23 MED ORDER — OXYCODONE HCL 5 MG PO TABS: 5.0000 mg | ORAL_TABLET | Freq: Once | ORAL | Status: DC | PRN

## 2024-04-23 MED ORDER — FUROSEMIDE 20 MG PO TABS
20.0000 mg | ORAL_TABLET | Freq: Every day | ORAL | Status: DC
  Filled 2024-04-23: qty 1

## 2024-04-23 MED ORDER — NIFEDIPINE ER OSMOTIC RELEASE 30 MG PO TB24
30.0000 mg | ORAL_TABLET | Freq: Two times a day (BID) | ORAL | Status: DC
  Administered 2024-04-24 – 2024-04-25 (×3): 30 mg via ORAL
  Filled 2024-04-23 (×3): qty 1

## 2024-04-23 MED ORDER — MORPHINE SULFATE (PF) 0.5 MG/ML IJ SOLN
INTRAMUSCULAR | Status: AC
  Filled 2024-04-23: qty 10

## 2024-04-23 MED ORDER — POTASSIUM CHLORIDE CRYS ER 20 MEQ PO TBCR
20.0000 meq | EXTENDED_RELEASE_TABLET | Freq: Every day | ORAL | Status: DC
  Filled 2024-04-23: qty 1

## 2024-04-23 MED ORDER — OXYCODONE HCL 5 MG/5ML PO SOLN: 5.0000 mg | Freq: Once | ORAL | Status: DC | PRN

## 2024-04-23 MED ORDER — FENTANYL CITRATE (PF) 100 MCG/2ML IJ SOLN
INTRAMUSCULAR | Status: DC | PRN
  Administered 2024-04-23: 10:00:00 15 ug via INTRATHECAL

## 2024-04-23 MED ORDER — CEFAZOLIN SODIUM-DEXTROSE 2-4 GM/100ML-% IV SOLN
2.0000 g | INTRAVENOUS | Status: AC
  Administered 2024-04-23: 10:00:00 2 g via INTRAVENOUS

## 2024-04-23 MED ORDER — OXYTOCIN-SODIUM CHLORIDE 30-0.9 UT/500ML-% IV SOLN
INTRAVENOUS | Status: DC | PRN
  Administered 2024-04-23: 10:00:00 300 mL via INTRAVENOUS

## 2024-04-23 MED ORDER — BUPIVACAINE IN DEXTROSE 0.75-8.25 % IT SOLN
INTRATHECAL | Status: DC | PRN
  Administered 2024-04-23: 10:00:00 1.6 mL via INTRATHECAL

## 2024-04-23 MED ORDER — OXYCODONE HCL 5 MG PO TABS: 5.0000 mg | ORAL_TABLET | ORAL | Status: DC | PRN

## 2024-04-23 MED ORDER — ACETAMINOPHEN 500 MG PO TABS
1000.0000 mg | ORAL_TABLET | Freq: Four times a day (QID) | ORAL | Status: DC
  Administered 2024-04-23 – 2024-04-25 (×8): 1000 mg via ORAL
  Filled 2024-04-23 (×8): qty 2

## 2024-04-23 MED ORDER — MORPHINE SULFATE (PF) 0.5 MG/ML IJ SOLN
INTRAMUSCULAR | Status: DC | PRN
  Administered 2024-04-23: 10:00:00 150 ug via EPIDURAL

## 2024-04-23 MED ORDER — PRENATAL MULTIVITAMIN CH
1.0000 | ORAL_TABLET | Freq: Every day | ORAL | Status: DC
  Administered 2024-04-24 – 2024-04-25 (×2): 1 via ORAL
  Filled 2024-04-23 (×3): qty 1

## 2024-04-23 MED ORDER — STERILE WATER FOR IRRIGATION IR SOLN
Status: DC | PRN
  Administered 2024-04-23: 11:00:00 1000 mL

## 2024-04-23 MED ORDER — TRANEXAMIC ACID-NACL 1000-0.7 MG/100ML-% IV SOLN
1000.0000 mg | Freq: Once | INTRAVENOUS | Status: AC
  Administered 2024-04-23: 10:00:00 1000 mg via INTRAVENOUS

## 2024-04-23 MED ORDER — ENOXAPARIN SODIUM 60 MG/0.6ML IJ SOSY
45.0000 mg | PREFILLED_SYRINGE | INTRAMUSCULAR | Status: DC
  Administered 2024-04-24: 45 mg via SUBCUTANEOUS
  Filled 2024-04-23: qty 0.6

## 2024-04-23 MED ORDER — FENTANYL CITRATE (PF) 100 MCG/2ML IJ SOLN: 25.0000 ug | INTRAMUSCULAR | Status: DC | PRN

## 2024-04-23 MED ORDER — ZOLPIDEM TARTRATE 5 MG PO TABS: 5.0000 mg | ORAL_TABLET | Freq: Every evening | ORAL | Status: DC | PRN

## 2024-04-23 MED ORDER — SIMETHICONE 80 MG PO CHEW: 80.0000 mg | CHEWABLE_TABLET | ORAL | Status: DC | PRN

## 2024-04-23 MED ORDER — SOD CITRATE-CITRIC ACID 500-334 MG/5ML PO SOLN
ORAL | Status: AC
  Filled 2024-04-23: qty 30

## 2024-04-23 MED ORDER — KETOROLAC TROMETHAMINE 30 MG/ML IJ SOLN
30.0000 mg | Freq: Four times a day (QID) | INTRAMUSCULAR | Status: AC
  Administered 2024-04-23 – 2024-04-24 (×3): 30 mg via INTRAVENOUS
  Filled 2024-04-23 (×3): qty 1

## 2024-04-23 MED ORDER — SOD CITRATE-CITRIC ACID 500-334 MG/5ML PO SOLN
30.0000 mL | ORAL | Status: AC
  Administered 2024-04-23: 10:00:00 30 mL via ORAL

## 2024-04-23 MED ORDER — OXYTOCIN-SODIUM CHLORIDE 30-0.9 UT/500ML-% IV SOLN
2.5000 [IU]/h | INTRAVENOUS | Status: AC
  Administered 2024-04-23: 14:00:00 2.5 [IU]/h via INTRAVENOUS
  Filled 2024-04-23: qty 500

## 2024-04-23 MED ORDER — ONDANSETRON HCL 4 MG/2ML IJ SOLN
INTRAMUSCULAR | Status: AC
  Filled 2024-04-23: qty 2

## 2024-04-23 MED ORDER — SIMETHICONE 80 MG PO CHEW
80.0000 mg | CHEWABLE_TABLET | Freq: Three times a day (TID) | ORAL | Status: DC
  Administered 2024-04-23 – 2024-04-25 (×4): 80 mg via ORAL
  Filled 2024-04-23 (×5): qty 1

## 2024-04-23 MED ORDER — PHENYLEPHRINE HCL-NACL 20-0.9 MG/250ML-% IV SOLN
INTRAVENOUS | Status: DC | PRN
  Administered 2024-04-23: 10:00:00 60 ug/min via INTRAVENOUS

## 2024-04-23 MED ORDER — ACETAMINOPHEN 10 MG/ML IV SOLN
INTRAVENOUS | Status: DC | PRN
  Administered 2024-04-23: 11:00:00 1000 mg via INTRAVENOUS

## 2024-04-23 MED ORDER — SODIUM CHLORIDE 0.9 % IR SOLN
Status: DC | PRN
  Administered 2024-04-23: 11:00:00 1000 mL

## 2024-04-23 MED ORDER — DEXAMETHASONE SOD PHOSPHATE PF 10 MG/ML IJ SOLN
INTRAMUSCULAR | Status: DC | PRN
  Administered 2024-04-23: 10:00:00 10 mg via INTRAVENOUS

## 2024-04-23 MED ORDER — DIBUCAINE (PERIANAL) 1 % EX OINT: 1.0000 | TOPICAL_OINTMENT | CUTANEOUS | Status: DC | PRN

## 2024-04-23 MED ORDER — WITCH HAZEL-GLYCERIN EX PADS: 1.0000 | MEDICATED_PAD | CUTANEOUS | Status: DC | PRN

## 2024-04-23 MED ORDER — IBUPROFEN 600 MG PO TABS
600.0000 mg | ORAL_TABLET | Freq: Four times a day (QID) | ORAL | Status: DC
  Administered 2024-04-24 – 2024-04-25 (×5): 600 mg via ORAL
  Filled 2024-04-23 (×5): qty 1

## 2024-04-23 MED ORDER — FUROSEMIDE 20 MG PO TABS
20.0000 mg | ORAL_TABLET | Freq: Every day | ORAL | Status: DC
  Administered 2024-04-24 – 2024-04-25 (×2): 20 mg via ORAL
  Filled 2024-04-23 (×2): qty 1

## 2024-04-23 MED ORDER — FENTANYL CITRATE (PF) 100 MCG/2ML IJ SOLN
INTRAMUSCULAR | Status: AC
  Filled 2024-04-23: qty 2

## 2024-04-23 MED ORDER — ONDANSETRON HCL 4 MG/2ML IJ SOLN
INTRAMUSCULAR | Status: DC | PRN
  Administered 2024-04-23: 10:00:00 4 mg via INTRAVENOUS

## 2024-04-23 MED ORDER — COCONUT OIL OIL: 1.0000 | TOPICAL_OIL | Status: DC | PRN

## 2024-04-23 MED ORDER — LACTATED RINGERS IV SOLN: INTRAVENOUS | Status: AC

## 2024-04-23 MED ORDER — PHENYLEPHRINE HCL (PRESSORS) 10 MG/ML IV SOLN
INTRAVENOUS | Status: DC | PRN
  Administered 2024-04-23: 10:00:00 80 ug via INTRAVENOUS

## 2024-04-23 SURGICAL SUPPLY — 28 items
CHLORAPREP W/TINT 26 (MISCELLANEOUS) ×2 IMPLANT
CLAMP UMBILICAL CORD (MISCELLANEOUS) ×1 IMPLANT
CLOTH BEACON ORANGE TIMEOUT ST (SAFETY) ×1 IMPLANT
DERMABOND ADVANCED .7 DNX12 (GAUZE/BANDAGES/DRESSINGS) IMPLANT
DRSG OPSITE POSTOP 4X10 (GAUZE/BANDAGES/DRESSINGS) ×1 IMPLANT
ELECTRODE REM PT RTRN 9FT ADLT (ELECTROSURGICAL) ×1 IMPLANT
EXTRACTOR VACUUM M CUP 4 TUBE (SUCTIONS) IMPLANT
GLOVE BIO SURGEON STRL SZ7.5 (GLOVE) ×1 IMPLANT
GLOVE BIOGEL PI IND STRL 7.0 (GLOVE) ×2 IMPLANT
GLOVE BIOGEL PI IND STRL 8 (GLOVE) ×1 IMPLANT
GOWN STRL REUS W/TWL LRG LVL3 (GOWN DISPOSABLE) ×2 IMPLANT
GOWN STRL REUS W/TWL XL LVL3 (GOWN DISPOSABLE) ×1 IMPLANT
KIT ABG SYR 3ML LUER SLIP (SYRINGE) IMPLANT
MAT PREVALON FULL STRYKER (MISCELLANEOUS) IMPLANT
NDL HYPO 25X5/8 SAFETYGLIDE (NEEDLE) IMPLANT
NEEDLE HYPO 25X5/8 SAFETYGLIDE (NEEDLE) IMPLANT
NS IRRIG 1000ML POUR BTL (IV SOLUTION) ×1 IMPLANT
PACK C SECTION WH (CUSTOM PROCEDURE TRAY) ×1 IMPLANT
PAD OB MATERNITY 4.3X12.25 (PERSONAL CARE ITEMS) ×1 IMPLANT
RTRCTR C-SECT PINK 25CM LRG (MISCELLANEOUS) ×1 IMPLANT
SUT MNCRL 0 VIOLET CTX 36 (SUTURE) ×2 IMPLANT
SUT PLAIN ABS 2-0 CT1 27XMFL (SUTURE) IMPLANT
SUT VIC AB 0 CTX36XBRD ANBCTRL (SUTURE) ×1 IMPLANT
SUT VIC AB 2-0 CT1 TAPERPNT 27 (SUTURE) ×1 IMPLANT
SUT VIC AB 4-0 KS 27 (SUTURE) ×1 IMPLANT
TOWEL OR 17X24 6PK STRL BLUE (TOWEL DISPOSABLE) ×1 IMPLANT
TRAY FOLEY W/BAG SLVR 14FR LF (SET/KITS/TRAYS/PACK) ×1 IMPLANT
WATER STERILE IRR 1000ML POUR (IV SOLUTION) ×1 IMPLANT

## 2024-04-23 NOTE — Transfer of Care (Signed)
 Immediate Anesthesia Transfer of Care Note  Patient: Carly Washington  Procedure(s) Performed: CESAREAN DELIVERY (Abdomen)  Patient Location: PACU  Anesthesia Type:Spinal  Level of Consciousness: awake, alert , and oriented  Airway & Oxygen Therapy: Patient Spontanous Breathing  Post-op Assessment: Report given to RN and Post -op Vital signs reviewed and stable  Post vital signs: Reviewed and stable  Last Vitals:  Vitals Value Taken Time  BP 97/53 04/23/24 11:15  Temp    Pulse 61 04/23/24 11:17  Resp 22 04/23/24 11:17  SpO2 93 % 04/23/24 11:17  Vitals shown include unfiled device data.  Last Pain:  Vitals:   04/23/24 0824  TempSrc:   PainSc: 0-No pain         Complications: No notable events documented.

## 2024-04-23 NOTE — Op Note (Addendum)
 Cesarean Section Operative Note   Patient: Carly Washington  Date of Procedure: 04/23/2024  Procedure: Primary Low Transverse Cesarean   Indications: malpresentation: breech    Pre-operative Diagnosis: postdates breech presentation.   Post-operative Diagnosis: Same  TOLAC Candidate: No  Surgeon: Surgeons and Role:    * Cresenzo, Norleen GAILS, MD - Primary    * Izell Harari, MD - Assisting    * Danny Geralds, DO - Fellow  Assistants: An experienced assistant was required given the standard of surgical care given the complexity of the case.  This assistant was needed for exposure, dissection, suctioning, retraction, instrument exchange, assisting with delivery with administration of fundal pressure, and for overall help during the procedure.   Anesthesia: spinal  Anesthesiologist: Mallory Manus, MD   Antibiotics: Cefazolin    Estimated Blood Loss: 238 ml   Total IV Fluids: 1400 ml  Urine Output: 50 cc OF clear urine  Specimens: none, patient taking placenta home   Complications: no complications   Indications: Carly Washington is a 43 y.o. G2P0010 with an IUP [redacted]w[redacted]d presenting for scheduled cesarean secondary to the indications listed above. Clinical course notable for breech presentation.  The risks of cesarean section discussed with the patient included but were not limited to: bleeding which may require transfusion or reoperation; infection which may require antibiotics; injury to bowel, bladder, ureters or other surrounding organs; injury to the fetus; need for additional procedures including hysterectomy in the event of a life-threatening hemorrhage; placental abnormalities with subsequent pregnancies, incisional problems, thromboembolic phenomenon and other postoperative/anesthesia complications. The patient concurred with the proposed plan, giving informed written consent for the procedure. Patient has been NPO since last night she will remain NPO for procedure. Anesthesia and OR  aware. Preoperative prophylactic antibiotics and SCDs ordered on call to the OR.   Findings: Viable infant in complete breech presentation, no nuchal cord present. Apgars 3, 9, . Weight 3610 g. Clear amniotic fluid. Normal placenta, three vessel cord. Fibroid uterus, Normal bilateral fallopian tubes, Normal bilateral ovaries. No adhesive disease was encountered.  Procedure Details: A Time Out was held and the above information confirmed. The patient received intravenous antibiotics and had sequential compression devices applied to her lower extremities preoperatively. The patient was taken back to the operative suite where spinal anesthesia was administered. After induction of anesthesia, the patient was draped and prepped in the usual sterile manner and placed in a dorsal supine position with a leftward tilt. A low transverse skin incision was made with scalpel and carried down through the subcutaneous tissue to the fascia. Fascial incision was made and extended transversely. The fascia was separated from the underlying rectus tissue superiorly and inferiorly. The rectus muscles were separated in the midline bluntly and the peritoneum was entered bluntly. An Alexis retractor was placed to aid in visualization of the uterus. A bladder flap was not developed. A low transverse uterine incision was made.  The infant was delivered with a difficult extraction, initial presentation was back down so infant was rotated to back up.  Both arms were crossed above the head so with some rotation they were able to be delivered right arm first and then left.  That was then delivered.  The infant looked stunned and did not initially have a cry so the umbilical cord was clamped immediately. Cord ph was sent, and cord blood was obtained for evaluation. The placenta was removed Intact and appeared lightly calcified consistent with GA. The uterine incision was closed with a single layer running unlocked  suture of 0-Monocryl.  Overall, excellent hemostasis was noted. The abdomen and the pelvis were cleared of all clot and debris and the Thersia was removed. Hemostasis was confirmed on all surfaces.  The peritoneum was reapproximated using 2-0 vicryl . The fascia was then closed using 0 Vicryl in a running fashion. The subcutaneous layer was reapproximated with 2-0 plain gut suture. The skin was closed with a 4-0 vicryl subcuticular stitch. The patient tolerated the procedure well. Sponge, lap, instrument and needle counts were correct x 2. She was taken to the recovery room in stable condition.  Disposition: PACU - hemodynamically stable.    Signed: Barabara Maier, DO [facility] Fellow [facility]

## 2024-04-23 NOTE — Anesthesia Preprocedure Evaluation (Addendum)
 Anesthesia Evaluation  Patient identified by MRN, date of birth, ID band Patient awake    Reviewed: Allergy & Precautions, H&P , NPO status , Patient's Chart, lab work & pertinent test results, reviewed documented beta blocker date and time   Airway Mallampati: II  TM Distance: >3 FB Neck ROM: full    Dental no notable dental hx. (+) Teeth Intact, Dental Advisory Given   Pulmonary neg pulmonary ROS   Pulmonary exam normal breath sounds clear to auscultation       Cardiovascular hypertension, negative cardio ROS Normal cardiovascular exam Rhythm:regular Rate:Normal     Neuro/Psych negative neurological ROS  negative psych ROS   GI/Hepatic Neg liver ROS,GERD  Medicated,,  Endo/Other  negative endocrine ROS    Renal/GU negative Renal ROS  negative genitourinary   Musculoskeletal   Abdominal   Peds  Hematology negative hematology ROS (+)   Anesthesia Other Findings   Reproductive/Obstetrics (+) Pregnancy                              Anesthesia Physical Anesthesia Plan  ASA: 2  Anesthesia Plan: Spinal   Post-op Pain Management: Minimal or no pain anticipated   Induction: Intravenous  PONV Risk Score and Plan: 2 and Treatment may vary due to age or medical condition  Airway Management Planned: Natural Airway and Simple Face Mask  Additional Equipment: Fetal Monitoring  Intra-op Plan:   Post-operative Plan:   Informed Consent: I have reviewed the patients History and Physical, chart, labs and discussed the procedure including the risks, benefits and alternatives for the proposed anesthesia with the patient or authorized representative who has indicated his/her understanding and acceptance.       Plan Discussed with: Anesthesiologist and CRNA  Anesthesia Plan Comments: (  )         Anesthesia Quick Evaluation

## 2024-04-23 NOTE — Progress Notes (Signed)
 Bps moderate range. Asymptomatic. Labs pending. Lasix  ordered. Will start procardia , monitor for signs/symptoms severe disease.

## 2024-04-23 NOTE — Discharge Summary (Signed)
 Postpartum Discharge Summary  Date of Service updated***     Patient Name: Carly Washington DOB: 01-Jun-1980 MRN: [id number]  Date of admission: 04/23/2024 Delivery date:04/23/2024 Delivering provider: ILEAN NORLEEN GAILS Date of discharge: 04/23/2024  Admitting diagnosis: Malpresentation before onset of labor [O32.9XX0] Intrauterine pregnancy: [redacted]w[redacted]d     Secondary diagnosis:  Principal Problem:   Malpresentation before onset of labor  Additional problems: ***    Discharge diagnosis: Term Pregnancy Delivered and Gestational Hypertension                                              Post partum procedures:{Postpartum procedures:23558} Augmentation: N/A Complications: None  Hospital course: Sceduled C/S   43 y.o. yo G2P1011 at [redacted]w[redacted]d was admitted to the hospital 04/23/2024 for scheduled cesarean section with the following indication:Malpresentation.Delivery details are as follows:  Membrane Rupture Time/Date: 10:19 AM,04/23/2024  Delivery Method:C-Section, Low Transverse Operative Delivery:N/A Details of operation can be found in separate operative note.  Patient had a postpartum course complicated by***.  She is ambulating, tolerating a regular diet, passing flatus, and urinating well. Patient is discharged home in stable condition on  04/23/2024        Newborn Data: Birth date:04/23/2024 Birth time:10:22 AM Gender:Female Living status:Living Apgars:3 ,9  Weight:3610 g    Magnesium Sulfate received: {Mag received:30440022} BMZ received: No Rhophylac:N/A MMR:N/A T-DaP:{Tdap:23962} Flu: {Qol:76036} RSV Vaccine received: {RSV:31013} Transfusion:{Transfusion received:30440034}  Immunizations received:  There is no immunization history on file for this patient.  Physical exam  Vitals:   04/23/24 0920 04/23/24 1110 04/23/24 1112 04/23/24 1116  BP: 138/63  (!) 98/52 (!) 97/53  Pulse: 85   (!) 59  Resp: 18   13  Temp:  (P) 97.6 F (36.4 C)    TempSrc:  (P) Axillary     SpO2:    97%  Weight:      Height:       General: {Exam; general:21111117} Lochia: {Desc; appropriate/inappropriate:30686::appropriate} Uterine Fundus: {Desc; firm/soft:30687} Incision: {Exam; incision:21111123} DVT Evaluation: {Exam; dvt:2111122} Labs: Lab Results  Component Value Date   WBC 10.6 (H) 04/22/2024   HGB 11.7 (L) 04/22/2024   HCT 36.3 04/22/2024   MCV 87.1 04/22/2024   PLT 326 04/22/2024      Latest Ref Rng & Units 04/22/2024   10:06 AM  CMP  Glucose 70 - 99 mg/dL 81   BUN 6 - 20 mg/dL 12   Creatinine 9.55 - 1.00 mg/dL 9.07   Sodium 864 - 854 mmol/L 134   Potassium 3.5 - 5.1 mmol/L 4.5   Chloride 98 - 111 mmol/L 103   CO2 22 - 32 mmol/L 22   Calcium 8.9 - 10.3 mg/dL 8.6   Total Protein 6.5 - 8.1 g/dL 6.6   Total Bilirubin 0.0 - 1.2 mg/dL 0.7   Alkaline Phos 38 - 126 U/L 164   AST 15 - 41 U/L 20   ALT 0 - 44 U/L 14    Edinburgh Score:     No data to display         No data recorded  After visit meds:  Allergies as of 04/23/2024       Reactions   Sulfa Antibiotics Hives, Itching     Med Rec must be completed prior to using this [facility]***        Discharge home in stable condition Infant Feeding: {  Baby feeding:23562} Infant Disposition:{CHL IP OB HOME WITH FNUYZM:76418} Discharge instruction: per After Visit Summary and Postpartum booklet. Activity: Advance as tolerated. Pelvic rest for 6 weeks.  Diet: {OB ipzu:78888878} Future Appointments:No future appointments.  Follow up Visit: Message sent 12/16  Please schedule this patient for a In person postpartum visit in 6 weeks with the following provider: Any provider. Additional Postpartum F/U:Incision check 1 week and BP check 1 week  High risk pregnancy complicated by: GDM Delivery mode:  C-Section, Low Transverse Anticipated Birth Control:  Unsure   04/23/2024 Barabara Maier, DO
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
## 2024-04-23 NOTE — Anesthesia Procedure Notes (Addendum)
 Spinal  Patient location during procedure: OR Start time: 04/23/2024 9:50 AM End time: 04/23/2024 9:56 AM Reason for block: surgical anesthesia  Staffing Performed: anesthesiologist  Authorized by: Mallory Manus, MD   Performed by: Mallory Manus, MD  Preanesthetic Checklist Completed: patient identified, IV checked, site marked, risks and benefits discussed, surgical consent, monitors and equipment checked, pre-op evaluation and timeout performed Spinal Block Patient position: sitting Prep: DuraPrep Patient monitoring: heart rate, cardiac monitor, continuous pulse ox and blood pressure Approach: midline Location: L3-4 Injection technique: single-shot Needle Needle type: Sprotte  Needle gauge: 24 G Needle length: 9 cm Assessment Sensory level: T4 Events: CSF return

## 2024-04-23 NOTE — Plan of Care (Signed)

## 2024-04-23 NOTE — Progress Notes (Signed)
 Pt refuses for placenta to go to pathology and wishes to take placenta home. Dr. Cresenzo aware.

## 2024-04-24 LAB — CBC
HCT: 27.7 % — ABNORMAL LOW (ref 36.0–46.0)
Hemoglobin: 9.5 g/dL — ABNORMAL LOW (ref 12.0–15.0)
MCH: 28.6 pg (ref 26.0–34.0)
MCHC: 34.3 g/dL (ref 30.0–36.0)
MCV: 83.4 fL (ref 80.0–100.0)
Platelets: 233 K/uL (ref 150–400)
RBC: 3.32 MIL/uL — ABNORMAL LOW (ref 3.87–5.11)
RDW: 15 % (ref 11.5–15.5)
WBC: 16.4 K/uL — ABNORMAL HIGH (ref 4.0–10.5)
nRBC: 0 % (ref 0.0–0.2)

## 2024-04-24 NOTE — Anesthesia Postprocedure Evaluation (Signed)
 Anesthesia Post Note  Patient: Carly Washington  Procedure(s) Performed: CESAREAN DELIVERY (Abdomen)     Patient location during evaluation: PACU Anesthesia Type: Spinal Level of consciousness: oriented and awake and alert Pain management: pain level controlled Vital Signs Assessment: post-procedure vital signs reviewed and stable Respiratory status: spontaneous breathing, respiratory function stable and patient connected to nasal cannula oxygen Cardiovascular status: blood pressure returned to baseline and stable Postop Assessment: no headache, no backache and no apparent nausea or vomiting Anesthetic complications: no   No notable events documented.  Last Vitals:  Vitals:   04/23/24 2310 04/24/24 1154  BP: 131/89 (!) 132/92  Pulse: 71   Resp: 18 18  Temp: 36.8 C (!) 36.4 C  SpO2: 99% 99%    Last Pain:  Vitals:   04/24/24 1154  TempSrc: Oral  PainSc:    Pain Goal:                   Virgen Belland

## 2024-04-24 NOTE — Plan of Care (Signed)

## 2024-04-24 NOTE — Lactation Note (Signed)
 This note was copied from a baby's chart. Lactation Consultation Note  Patient Name: Carly Washington Unijb'd Date: 04/24/2024 Age:42 hours Reason for consult: Initial assessment;Primapara;Term  P1. Mom wanted assistance in latching.  LC getting baby un-wrapped for latching and baby started gagging and choking. LC leaned baby over and patted on back. Baby had emesis. LC used bulb syringe to clear mouth. Baby doing a lot of gagging  and heaving. Explained to mom the baby can't feed when she is like that. Baby doesn't want to eat when she is like that. Baby has trying not to trow up when she is moving her mouth and trying not to throw up. That wasn't cueing to feed. That was I need to throw up. Lean baby over and pat on back use bulb syringe to clear mouth. Newborn feeding habits, STS, I&O, positioning reviewed. Baby has nasal congestion. RN stated baby had emesis coming from her nose and mouth.  Suggested mom call for Lactation tomorrow when baby is feeling better and ready to feed.  Maternal Data Does the patient have breastfeeding experience prior to this delivery?: No  Feeding    LATCH Score       Type of Nipple: Everted at rest and after stimulation  Comfort (Breast/Nipple): Soft / non-tender         Lactation Tools Discussed/Used    Interventions Interventions: Breast feeding basics reviewed;Adjust position;Support pillows;Position options;Education;LC Services brochure  Discharge    Consult Status Consult Status: Follow-up Date: 04/24/24 Follow-up type: In-patient    Kaiyan Luczak G 04/24/2024, 2:24 AM

## 2024-04-24 NOTE — Progress Notes (Signed)
 Subjective: Postpartum Day 1: Cesarean Delivery Patient reports tolerating PO, + flatus, and no problems voiding.    Objective: Vital signs in last 24 hours: Temp:  [97.1 F (36.2 C)-98.3 F (36.8 C)] 98.3 F (36.8 C) (12/16 2310) Pulse Rate:  [55-71] 71 (12/16 2310) Resp:  [13-24] 18 (12/16 2310) BP: (97-159)/(52-94) 131/89 (12/16 2310) SpO2:  [96 %-100 %] 99 % (12/16 2310)  Physical Exam:  General: alert, cooperative, and no distress Lochia: appropriate Uterine Fundus: firm Incision: no significant drainage DVT Evaluation: No evidence of DVT seen on physical exam.  Recent Labs    04/23/24 1649 04/24/24 0514  HGB 11.0* 9.5*  HCT 33.4* 27.7*    Assessment/Plan: Status post Cesarean section. Doing well postoperatively.  Continue current care.  Olam Boards, CNM 04/24/2024, 9:23 AM

## 2024-04-24 NOTE — Lactation Note (Signed)
 This note was copied from a baby's chart. 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 SlP in during latch too) LC offered to assist and mom receptive. LC changed a wet diaper.  LC 1st assisted to latch and it was on and off, unable to keep a seal at the breast.  LC recommended trying the #20 NS, and reviewed  application of the NS.  Baby at 1st latched on and off, and then finally was able to get a better for approximately 15 mins , NO milk in the Nipple Shield and No swallows noted.  SLP Dacia in the room and took over with pace feeding the baby on her side. See her note for details.  LC discussed with parents the important goals for the baby now.  Keeping the weight loss at bay, keeping the energy level up so the baby can feed well, and prevent the baby from being to fussy to feed at the breast and the bottle.  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  LC reassured parents the [facility], and SLP is here to assist them and support mom establishing milk supply.  DEBP was set up and mom started pumping with #18 F after SLP finished with teaching them pace feeding.. Per mom comfortable and still pumping.  Maternal Data Has patient been taught Hand Expression?: Yes Does the patient have breastfeeding experience prior to this delivery?: No  Feeding Mother's Current Feeding Choice: Breast Milk and Formula (mom undecided about donor milk) Nipple Type: Dr. Jonna Preemie (with blue valve)  LATCH Score Latch: Repeated attempts needed to sustain latch, nipple held in mouth throughout feeding, stimulation needed to elicit sucking reflex.  Audible  Swallowing: None  Type of Nipple: Everted at rest and after stimulation  Comfort (Breast/Nipple): Soft / non-tender  Hold (Positioning): Full assist, staff holds infant at breast  LATCH Score: 5   Lactation Tools Discussed/Used Tools: Pump;Flanges;Nipple Shields Nipple shield size: 20 Flange Size: 18;Other (comment) (mom aware she will need a smaller flange) Breast pump type: Double-Electric Breast Pump Pump Education: Milk Storage;Setup, frequency, and cleaning  Interventions Interventions: Breast feeding basics reviewed;Assisted with latch;Skin to skin;Hand express;Adjust position;Support pillows;DEBP;Education;LC Services brochure;CDC milk storage guidelines;CDC Guidelines for Breast Pump Cleaning  Discharge Pump: Personal;DEBP (per mom DEBP Spectra  and has ordered her Flange inserts) WIC Program: No  Consult Status Consult Status: Follow-up Date: 04/25/24 Follow-up type: In-patient    Rollene Caldron Auset Fritzler 04/24/2024, 3:09 PM

## 2024-04-24 NOTE — Clinical Social Work Maternal (Signed)
 CLINICAL SOCIAL WORK MATERNAL/CHILD NOTE  Patient Details  Name: Carly Washington MRN: [id number] Date of Birth: [birth year]  Date:  04/24/2024  Clinical Social Worker Initiating Note:  Rosina Molt Date/Time: Initiated:  04/24/24/1449     Child's Name:      Biological Parents:  Mother, Father Carly Washington [birth year] 1981 Carly Washington 06-27-1989)   Need for Interpreter:  None   Reason for Referral:  Late or No Prenatal Care     Address:  [address] [place] [address]    Phone number:  [phone] (home)     Additional phone number:   Household Members/Support Persons (HM/SP):   Household Member/Support Person 1   HM/SP Name Relationship DOB or Age  HM/SP -1 Carly Washington FOB 06-27-1989  HM/SP -2        HM/SP -3        HM/SP -4        HM/SP -5        HM/SP -6        HM/SP -7        HM/SP -8          Natural Supports (not living in the home):  Immediate Family, Spouse/significant other   Professional Supports: None   Employment: Environmental Education Officer   Type of Work: [employer]   Education:  Engineer, maintenance (it)   Homebound arranged:    Surveyor, Quantity Resources:  Media Planner    Other Resources:      Cultural/Religious Considerations Which May Impact Care:    Strengths:  Ability to meet basic needs  , Home prepared for child  , Pediatrician chosen   Psychotropic Medications:         Pediatrician:    [facility] (including [place])  Pediatrician List:   [facility]  ([place] Customer Service Manager)    Pediatrician Fax Number:    Risk Factors/Current Problems:   (Limited/No Prenatal Care)   Cognitive State:  Able to Concentrate  , Alert  , Linear Thinking  , Insightful  , Goal Oriented     Mood/Affect:  Calm  , Comfortable  , Interested  , Relaxed     CSW Assessment: CSW received a consult due to Limited/No Prenatal care and met MOB at bedside to  complete a full psychosocial assessment. CSW entered the room, introduced herself and acknowledged her family was present. CSW asked MOB for privacy reasons could her guest step out for the assessment; MOB was agreeable and her family stepped, however FOB remained in the room. MOB gave CSW verbal permission to speak about anything while FOB was present. CSW explained her role and the reason for the visit. MOB was polite, easy to engage, receptive to meeting with CSW, and appeared forthcoming.  CSW collected MOB's demographic information and inquired about her mental health history. MOB denied any/all mental health history. CSW provided education regarding the baby blues period vs. perinatal mood disorders, discussed treatment and gave resources for mental health follow up if concerns arise.  CSW recommends self-evaluation during the postpartum time period using the New Mom Checklist from Postpartum Progress and encouraged MOB to contact a medical professional if symptoms are noted at any time. CSW assessed for safety with MOB SI and HI; MOB denied all. CSW did not assess for DV; FOB was present.  CSW asked MOB does she receive support resources;  MOB said No(WIC and food stamps).  MOB reported having all essential items for the infant including a carseat, bassinet and crib for safe sleeping. CSW provided review of Sudden Infant Death Syndrome (SIDS) precautions.  CSW asked MOB about the barriers she experienced with obtaining PNC. MOB reported she utilized a Dance Movement Psychotherapist services during her pregnancy. MOB reported her midwife does not want to be named; however her Doula's name is  Carly Washington. MOB reported meeting with these individuals on a consistent basis throughout her pregnancy. CSW informed MOB due to Limited/No Prenatal Care during her pregnancy; the hospital will perform a UDS and CDS on the infant. If the screenings return with positive results a report to CPS will be made; MOB was understanding.  MOB denied use of any illegal substances.   The infant UDS was negative for all substances. CSW will continue to monitor the CDS and complete a CPS report if warranted.  CSW Plan/Description:  No Further Intervention Required/No Barriers to Discharge, Sudden Infant Death Syndrome (SIDS) Education, Perinatal Mood and Anxiety Disorder (PMADs) Education, Hospital Drug Screen Policy Information, Other Information/Referral to [facility], CSW Will Continue to Monitor Umbilical Cord Tissue Drug Screen Results and Make Report if Ranelle Rosina MARLA Joshua, LCSW 04/24/2024, 2:52 PM

## 2024-04-25 ENCOUNTER — Other Ambulatory Visit: Payer: Self-pay | Admitting: Family Medicine

## 2024-04-25 DIAGNOSIS — Z98891 History of uterine scar from previous surgery: Principal | ICD-10-CM

## 2024-04-25 MED ORDER — POTASSIUM CHLORIDE CRYS ER 20 MEQ PO TBCR: 20.0000 meq | EXTENDED_RELEASE_TABLET | Freq: Every day | ORAL | 0 refills | Status: DC

## 2024-04-25 MED ORDER — ACETAMINOPHEN 500 MG PO TABS: 1000.0000 mg | ORAL_TABLET | Freq: Four times a day (QID) | ORAL | Status: DC

## 2024-04-25 MED ORDER — COCONUT OIL OIL: 1.0000 | TOPICAL_OIL | Status: DC | PRN

## 2024-04-25 MED ORDER — FERROUS SULFATE 325 (65 FE) MG PO TABS: 325.0000 mg | ORAL_TABLET | ORAL | 1 refills | Status: DC

## 2024-04-25 MED ORDER — OXYCODONE HCL 5 MG PO TABS: 5.0000 mg | ORAL_TABLET | ORAL | 0 refills | Status: DC | PRN

## 2024-04-25 MED ORDER — IBUPROFEN 600 MG PO TABS: 600.0000 mg | ORAL_TABLET | Freq: Four times a day (QID) | ORAL | 1 refills | Status: DC

## 2024-04-25 MED ORDER — SENNOSIDES-DOCUSATE SODIUM 8.6-50 MG PO TABS: 2.0000 | ORAL_TABLET | Freq: Every day | ORAL | 1 refills | Status: DC

## 2024-04-25 MED ORDER — FERROUS SULFATE 325 (65 FE) MG PO TABS
325.0000 mg | ORAL_TABLET | ORAL | Status: DC
  Administered 2024-04-25: 12:00:00 325 mg via ORAL
  Filled 2024-04-25: qty 1

## 2024-04-25 MED ORDER — FUROSEMIDE 20 MG PO TABS: 20.0000 mg | ORAL_TABLET | Freq: Every day | ORAL | 0 refills | Status: DC

## 2024-04-25 MED ORDER — NIFEDIPINE ER 30 MG PO TB24: 30.0000 mg | ORAL_TABLET | Freq: Two times a day (BID) | ORAL | 1 refills | Status: DC

## 2024-04-25 NOTE — Progress Notes (Signed)
 POD #1   Subjective:  Carly Washington is a 43 y.o. G2P1011 [redacted]w[redacted]d s/p pLTCS for breech.  No acute events overnight.  Pt denies problems with ambulating, voiding or po intake.  She denies nausea or vomiting.  Pain is well controlled.  She has had flatus.  Lochia Small.  Plan for birth control is None.  Method of Feeding: breast and formula-- cleft palate   BP was elevated, denies HA, blurry vision, RUQ pain  Objective: Blood pressure (!) 142/89, pulse 83, temperature (!) 97.5 F (36.4 C), temperature source Axillary, resp. rate 20, height 5' 5 (1.651 m), weight 92.4 kg, last menstrual period 07/16/2023, SpO2 99%, unknown if currently breastfeeding.   Physical Exam:  General: alert, cooperative and no distress Lochia:normal flow Chest: normal WOB Heart: Regular rate Abdomen: +BS, soft, mild TTP (appropriate) Uterine Fundus: firm, under umbilicus DVT Evaluation: No evidence of DVT seen on physical exam. Extremities: minimal edema  Recent Labs    04/23/24 1649 04/24/24 0514  HGB 11.0* 9.5*  HCT 33.4* 27.7*    Assessment/Plan:  ASSESSMENT: Carly Washington is a 43 y.o. G2P1011 [redacted]w[redacted]d s/p pLTCS for breech  Plan for discharge tomorrow Continue routine PP care Breastfeeding support PRN  #Acute anemia, postoperative. Clinically significant- Started on oral iron. Denies symptoms of anemia. 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 Nifedipine  30 BID. Monitor BP. Had one severe range but was followed immediately by mild range.    LOS: 2 days   Carly Washington 04/25/2024, 10:00 AM

## 2024-04-25 NOTE — Lactation Note (Signed)
 This note was copied from a baby's chart. Lactation Consultation Note  Patient Name: Carly Washington Unijb'd Date: 04/25/2024 Age:43 years, P1  Reason for consult: Follow-up assessment;Primapara;1st time breastfeeding;Term;Infant weight loss Mom getting ready to feed and dad changed  a wet diaper.  Per parents, feel comfortable with the feeding plan set up yesterday. Per mom has pumped 4-5 times since yesterday and the most she has gotten is 5 ml. Dad voiced concern the baby has not stooled since late last evening. LC reviewed and the doc flow sheets and shared with parents the baby has stooled x 7 in the her life and is WNL.  LC reviewed supply and demand, praised mom for the amount of her pumping with the DEBP and encouraged her to think of it has a feeding so it would be recommended to increase the amount of pumping in 24 hours to 8-10 times. Both breast 15 -20 mins.  LC reassured mom pumping can be a slow process and  consistency enhances let down.  LC reviewed engorgement prevention and tx, importance of prevention. Storage of breast milk.  Mom started to feed the baby with the DR. Delores and baby was noted still to be doing well.  Parents are aware since baby is past 48 hours she should take at least 30 ml or greater per feeding and then by the 7 days at least 45 per feeding or greater.  Mom aware she can latch the baby , but she needs to be consistently supplemented.   Maternal Data Has patient been taught Hand Expression?: Yes  Feeding Mother's Current Feeding Choice: Breast Milk and Formula Nipple Type: Dr. Jonna Preemie  LATCH Score - baby did not latch, only bottle    Lactation Tools Discussed/Used  DEBP , Flange #18   Interventions Interventions: Breast feeding basics reviewed;Hand pump;DEBP;Education;LC Services brochure;CDC milk storage guidelines;CDC Guidelines for Breast Pump Cleaning  Discharge Discharge Education: Engorgement and breast care;Warning signs for  feeding baby Pump: DEBP;Personal (per mom Spectra ) Per mom has Flange inserts at home  Consult Status Consult Status: Complete Date: 04/25/24    Rollene Jenkins Fiedler 04/25/2024, 2:27 PM

## 2024-04-25 NOTE — Patient Instructions (Signed)
 If you are interested in an outpatient lactation consultation -- available in-office or virtually -- please reach out to us  at:  [facility] (First Floor) ?? [address] [place], [place]  ?? [phone] Please leave a message on our lactation voicemail box. We welcome any lactation-related questions or concerns -- our team is here to support you and your baby.  Lactation Support Groups Join us  at: [facility] ?? Tuesdays, 10:00 AM - 12:00 PM ?? [address], [place] [employer], [place]  Lactating parents and lap babies are welcome!  ?? [email or web address]  ?? [email or web address] -------------  Si est interesado en una consulta ambulatoria de lactancia, disponible en el consultorio o virtualmente, comunquese con nosotros en:  [facility] ([facility] ?? [address], [place], [place]  ?? [phone] Por favor, deje un mensaje en nuestro buzn de voz de lactancia. Estamos aqu para responder cualquier pregunta o inquietud relacionada con la lactancia y para apoyarle a usted y a su beb.  Grupos de Apoyo para la Lactancia nase a nosotros en: [facility] ?? [place], de 10:00 a. m. a 12:00 p. m. ?? [address], [place], [place]  Se admiten madres lactantes y bebs en regazo.  ?? [email or web address]  ?? [email or web address]      Delaney Mandril, [facility]

## 2024-04-30 ENCOUNTER — Ambulatory Visit: Payer: Self-pay

## 2024-04-30 ENCOUNTER — Other Ambulatory Visit: Payer: Self-pay

## 2024-04-30 VITALS — BP 155/92 | HR 76 | Wt 180.0 lb

## 2024-04-30 DIAGNOSIS — Z0130 Encounter for examination of blood pressure without abnormal findings: Secondary | ICD-10-CM

## 2024-04-30 NOTE — Progress Notes (Signed)
 Blood Pressure Check Visit  Carly Washington is here for blood pressure check following primary c-section on 04/23/24. Patient had a diagnosis of gHTN. Patient was prescribed Lasix /K+ for 4 days along with Nifedipine  30 mg BID. Patient reports that she completed 4 day course of Lasix /Potassium. Last dose of Nifedipine  was yesterday evening, 04/29/24. BP today is 142/88. Recheck BP is 155/92.  Patient denies any dizziness, blurred vision, headaches, shortness of breath or peripheral edema.  Chart reviewed by physician who advised patient to increase Procardia  to 60 mg in the morning and 30 mg in the evening and for a BP check early next week. Reviewed MAU precautions with patient and discussed physician's advised. Patient verbalized understanding and will make BP appointment at checkout.   Incision Check Visit  Carly Washington is also here for incision check following primary c-section on 05/03/24.   Assessment: Incision looks clean, dry, intact and well approximated. No s/s of infection noted.  Education: Reviewed good daily wound care and s/s of infection with patient. Advised patient to contact our office for any questions or concerns.  Patient has an PP appointment on 06/05/24 at 1055. Patient confirmed schedule appointment.   Rosaline Pendleton, RN 04/30/2024  10:20 AM

## 2024-05-06 ENCOUNTER — Ambulatory Visit: Payer: Self-pay

## 2024-06-05 ENCOUNTER — Encounter: Payer: Self-pay | Admitting: Certified Nurse Midwife

## 2024-06-05 ENCOUNTER — Ambulatory Visit: Payer: Self-pay | Admitting: Certified Nurse Midwife

## 2024-06-05 ENCOUNTER — Other Ambulatory Visit: Payer: Self-pay

## 2024-06-05 DIAGNOSIS — Z98891 History of uterine scar from previous surgery: Secondary | ICD-10-CM

## 2024-06-05 NOTE — Progress Notes (Signed)
 "   Postpartum Visit Note  Carly Washington is a 44 y.o. G65P1011 female who presents for a postpartum visit. She is 6 weeks 1 dayspostpartum following a primary cesarean section.  I have fully reviewed the prenatal and intrapartum course. The delivery was at [redacted]w[redacted]d. Anesthesia: spinal. Postpartum course has been much less complicated than the end of pregnancy/delivery. Has healed up well and been surprised by how good she feels. 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 Baby is feeding by breast. Bleeding no bleeding. Bowel function is normal. Bladder function is normal. Patient is sexually active. Contraception method is none. Postpartum depression screening: negative.   Edinburgh Postnatal Depression Scale - 06/05/24 1207       Edinburgh Postnatal Depression Scale:  In the Past 7 Days   I have been able to laugh and see the funny side of things. 0    I have looked forward with enjoyment to things. 0    I have blamed myself unnecessarily when things went wrong. 0    I have been anxious or worried for no good reason. 0    I have felt scared or panicky for no good reason. 0    Things have been getting on top of me. 0    I have been so unhappy that I have had difficulty sleeping. 0    I have felt sad or miserable. 0    I have been so unhappy that I have been crying. 0    The thought of harming myself has occurred to me. 0    Edinburgh Postnatal Depression Scale Total 0         Health Maintenance Due  Topic Date Due   Hepatitis C Screening  Never done   DTaP/Tdap/Td (1 - Tdap) Never done   Hepatitis B Vaccines 19-59 Average Risk (1 of 3 - 19+ 3-dose series) Never done   Mammogram  Never done   Influenza Vaccine  Never done   COVID-19 Vaccine (1 - 2025-26 season) Never done   The following portions of the patient's history were reviewed and updated as appropriate: allergies, current medications, past family history, past medical history, past social  history, past surgical history, and problem list.  Review of Systems Pertinent items noted in HPI and remainder of comprehensive ROS otherwise negative.  Objective:  BP 128/87   Pulse 65   Wt 181 lb 8 oz (82.3 kg)   LMP 07/16/2023 (Exact Date)   Breastfeeding Yes   BMI 30.20 kg/m    Constitutional: Alert, oriented female in no physical distress.  HEENT: PERRLA Skin: normal color and turgor, no rash Cardiovascular: normal rate & rhythm Respiratory: normal effort, no problems with respiration noted GI: Abd soft, non-tender MS: Extremities nontender, no edema, normal ROM Neurologic: Alert and oriented x 4.  Pelvic: exam not indicated Breasts: normal lactating breasts Incision: well-approximated without exudate, redness or edema.   Assessment:  1. Postpartum care and examination (Primary) - Recovering well  2. History of cesarean delivery - Reassured that she is a great candidate for VBAC, invited back to care if she decides to have another baby   Plan:   Essential components of care per ACOG recommendations:  1.  Mood and well being: Patient with negative depression screening today. Reviewed local resources for support.  - Patient tobacco use? No.   - hx of drug use? No.    2. Infant care and feeding:  -Patient currently breastmilk  feeding? Yes, doing well, baby gaining  -Social determinants of health (SDOH) reviewed in EPIC. No concerns   3. Sexuality, contraception and birth spacing - Patient does not know want a pregnancy in the next year.  Desired family size is currently unsure.  - Reviewed reproductive life planning. Reviewed contraceptive methods based on pt preferences and effectiveness.  Patient desired No Method - No Contraceptive Precautions today.   - Discussed birth spacing of 18 months  4. Sleep and fatigue -Encouraged family/partner/community support of 4 hrs of uninterrupted sleep to help with mood and fatigue  5. Physical Recovery  - Discussed  patients delivery and complications. She describes her labor as mixed. - Patient had a C-section for breech presentation. Went well, feels overall favorable about how things went and her recover. - Patient has urinary incontinence? No. - Patient is safe to resume physical and sexual activity  6.  Health Maintenance - HM due items addressed Yes - Last pap smear 11/07/22,  NILM, neg HPV. Pap smear not done at today's visit.  -Breast Cancer screening indicated? Not yet, currently breastfeeding.  7. Chronic Disease/Pregnancy Condition follow up: None - PCP follow up as needed  Cornell JONELLE Finder, CNM Center for [employer], [facility]  "
